# Patient Record
Sex: Male | Born: 1970 | Race: White | Hispanic: No | Marital: Single | Smoking: Never smoker
Health system: Southern US, Community
[De-identification: ages and names within clinical notes are randomized; demographics above are authoritative.]

## PROBLEM LIST (undated history)

## (undated) DIAGNOSIS — Z87442 Personal history of urinary calculi: Secondary | ICD-10-CM

## (undated) DIAGNOSIS — R519 Headache, unspecified: Secondary | ICD-10-CM

## (undated) DIAGNOSIS — C801 Malignant (primary) neoplasm, unspecified: Secondary | ICD-10-CM

## (undated) DIAGNOSIS — Z8673 Personal history of transient ischemic attack (TIA), and cerebral infarction without residual deficits: Secondary | ICD-10-CM

## (undated) DIAGNOSIS — J189 Pneumonia, unspecified organism: Secondary | ICD-10-CM

## (undated) DIAGNOSIS — K219 Gastro-esophageal reflux disease without esophagitis: Secondary | ICD-10-CM

## (undated) DIAGNOSIS — F319 Bipolar disorder, unspecified: Secondary | ICD-10-CM

## (undated) DIAGNOSIS — R51 Headache: Secondary | ICD-10-CM

## (undated) DIAGNOSIS — I10 Essential (primary) hypertension: Secondary | ICD-10-CM

## (undated) HISTORY — PX: NO PAST SURGERIES: SHX2092

## (undated) HISTORY — DX: Essential (primary) hypertension: I10

---

## 2009-04-27 DIAGNOSIS — Z8673 Personal history of transient ischemic attack (TIA), and cerebral infarction without residual deficits: Secondary | ICD-10-CM

## 2009-04-27 HISTORY — DX: Personal history of transient ischemic attack (TIA), and cerebral infarction without residual deficits: Z86.73

## 2009-06-14 ENCOUNTER — Ambulatory Visit: Payer: Self-pay | Admitting: Diagnostic Radiology

## 2009-06-14 ENCOUNTER — Emergency Department (HOSPITAL_BASED_OUTPATIENT_CLINIC_OR_DEPARTMENT_OTHER): Admission: EM | Admit: 2009-06-14 | Discharge: 2009-06-14 | Payer: Self-pay | Admitting: Emergency Medicine

## 2010-06-20 LAB — URINALYSIS, ROUTINE W REFLEX MICROSCOPIC
Bilirubin Urine: NEGATIVE
Ketones, ur: NEGATIVE mg/dL
Protein, ur: NEGATIVE mg/dL
Urobilinogen, UA: 0.2 mg/dL (ref 0.0–1.0)

## 2010-06-20 LAB — BASIC METABOLIC PANEL
GFR calc Af Amer: >60 mL/min (ref >60)
GFR calc non Af Amer: >60 mL/min (ref >60)
Potassium: 3.5 mEq/L (ref 3.5–5.1)
Sodium: 144 mEq/L (ref 135–145)

## 2010-06-20 LAB — DIFFERENTIAL
Basophils Absolute: 0.1 10*3/uL (ref 0.0–0.1)
Eosinophils Absolute: 0.1 10*3/uL (ref 0.0–0.7)
Eosinophils Relative: 2 % (ref 0–5)
Monocytes Absolute: 0.6 10*3/uL (ref 0.1–1.0)
Monocytes Relative: 7 % (ref 3–12)

## 2010-06-20 LAB — CBC
Platelets: 265 10*3/uL (ref 150–400)
RBC: 5.25 MIL/uL (ref 4.22–5.81)
RDW: 12.4 % (ref 11.5–15.5)
WBC: 9.4 10*3/uL (ref 4.0–10.5)

## 2010-06-20 LAB — POCT TOXICOLOGY PANEL

## 2010-06-20 LAB — T3 UPTAKE: T3 Uptake Ratio: 40.3 % — ABNORMAL HIGH (ref 22.5–37.0)

## 2010-06-20 LAB — TSH: TSH: 0.693 u[IU]/mL (ref 0.350–4.500)

## 2010-06-20 LAB — T4: T4, Total: 8.9 ug/dL (ref 5.0–12.5)

## 2011-05-08 ENCOUNTER — Other Ambulatory Visit: Payer: Self-pay | Admitting: *Deleted

## 2011-05-08 MED ORDER — LISINOPRIL-HYDROCHLOROTHIAZIDE 10-12.5 MG PO TABS: 2.0000 | ORAL_TABLET | Freq: Every day | ORAL | Status: DC

## 2011-09-13 ENCOUNTER — Ambulatory Visit (INDEPENDENT_AMBULATORY_CARE_PROVIDER_SITE_OTHER): Payer: BC Managed Care – PPO | Admitting: Family Medicine

## 2011-09-13 VITALS — BP 115/82 | HR 112 | Temp 98.0°F | Resp 18 | Ht 67.0 in | Wt 229.0 lb

## 2011-09-13 DIAGNOSIS — Z0000 Encounter for general adult medical examination without abnormal findings: Secondary | ICD-10-CM

## 2011-09-13 DIAGNOSIS — F39 Unspecified mood [affective] disorder: Secondary | ICD-10-CM

## 2011-09-13 DIAGNOSIS — I10 Essential (primary) hypertension: Secondary | ICD-10-CM

## 2011-09-13 DIAGNOSIS — D751 Secondary polycythemia: Secondary | ICD-10-CM

## 2011-09-13 LAB — COMPREHENSIVE METABOLIC PANEL
ALT: 31 U/L (ref 0–53)
AST: 24 U/L (ref 0–37)
Albumin: 4.7 g/dL (ref 3.5–5.2)
BUN: 16 mg/dL (ref 6–23)
Calcium: 10.4 mg/dL (ref 8.4–10.5)
Chloride: 106 mEq/L (ref 96–112)
Creat: 1.24 mg/dL (ref 0.50–1.35)
Total Bilirubin: 1.1 mg/dL (ref 0.3–1.2)

## 2011-09-13 LAB — LIPID PANEL
Cholesterol: 190 mg/dL (ref 0–200)
HDL: 35 mg/dL — ABNORMAL LOW
LDL Cholesterol: 137 mg/dL — ABNORMAL HIGH (ref 0–99)
Total CHOL/HDL Ratio: 5.4 ratio
Triglycerides: 92 mg/dL
VLDL: 18 mg/dL (ref 0–40)

## 2011-09-13 LAB — POCT CBC
Granulocyte percent: 63.3 % (ref 37–80)
HCT, POC: 56.3 % — AB (ref 43.5–53.7)
Hemoglobin: 18.3 g/dL — AB (ref 14.1–18.1)
Lymph, poc: 3 (ref 0.6–3.4)
MCH, POC: 31.1 pg (ref 27–31.2)
MCHC: 32.5 g/dL (ref 31.8–35.4)
MCV: 95.6 fL (ref 80–97)
MID (cbc): 0.8 (ref 0–0.9)
MPV: 9.3 fL (ref 0–99.8)
POC Granulocyte: 6.5 (ref 2–6.9)
POC LYMPH PERCENT: 29.1 % (ref 10–50)
POC MID %: 7.6 % (ref 0–12)
Platelet Count, POC: 330 10*3/uL (ref 142–424)
RBC: 5.89 M/uL (ref 4.69–6.13)
RDW, POC: 13.1 %
WBC: 10.2 10*3/uL (ref 4.6–10.2)

## 2011-09-13 MED ORDER — LAMOTRIGINE 100 MG PO TABS: 150.0000 mg | ORAL_TABLET | Freq: Every day | ORAL | Status: DC

## 2011-09-13 MED ORDER — LISINOPRIL-HYDROCHLOROTHIAZIDE 20-25 MG PO TABS: 1.0000 | ORAL_TABLET | Freq: Every day | ORAL | Status: DC

## 2011-09-13 NOTE — Addendum Note (Signed)
Addended by: Abbe Amsterdam C on: 09/13/2011 02:52 PM   Modules accepted: Orders

## 2011-09-13 NOTE — Progress Notes (Signed)
Patient Name: [name] Date of Birth: 17-Aug-1970 Medical Record Number: [id number] Gender: male Date of Encounter: 09/13/2011  History of Present Illness:  [name] is a 41 y.o. very pleasant male patient who presents with the following:  Here today for a PE.  He is being treated for a mood disorder and is stable on his medications.  However, his psychiatrist has moved away, and he needs to find a new one.  He is fasting today for labs.  He is aware that he needs to lose weight and lately has been working on eating less.  He had lost down to 205 lbs, but gained it back in the interim.  He has been too busy to eat well sometimes.    Discussed PSA testing - he would like to delay this  Needs a form filled out for his job- height/ weight/ BP/ etc  There is no problem list on file for this patient.  No past medical history on file. No past surgical history on file. History  Substance Use Topics  . Smoking status: Never Smoker   . Smokeless tobacco: Not on file  . Alcohol Use: Not on file   No family history on file. Allergies  Allergen Reactions  . Codeine     Medication list has been reviewed and updated.  Prior to Admission medications   Medication Sig Start Date End Date Taking? Authorizing Provider  buPROPion (WELLBUTRIN XL) 300 MG 24 hr tablet Take 300 mg by mouth daily.   Yes Historical Provider, MD  diphenhydrAMINE (SOMINEX) 25 MG tablet Take 25 mg by mouth at bedtime as needed.   Yes Historical Provider, MD  lamoTRIgine (LAMICTAL) 150 MG tablet Take 150 mg by mouth daily.   Yes Historical Provider, MD  lisinopril-hydrochlorothiazide (PRINZIDE,ZESTORETIC) 10-12.5 MG per tablet Take 2 tablets by mouth daily. 05/08/11 05/07/12  Sondra Barges, PA-C    Review of Systems:  As per HPI- otherwise negative.   Physical Examination: Filed Vitals:   09/13/11 1337  BP: 115/82  Pulse: 112  Temp: 98 F (36.7 C)  Resp: 18   Filed Vitals:   09/13/11 1337  Height:  5\' 7"  (1.702 m)  Weight: 229 lb (103.874 kg)  recheck pulse 90 BPM Body mass index is 35.87 kg/(m^2). Ideal Body Weight: Weight in (lb) to have BMI = 25: 159.3   GEN: WDWN, NAD, Non-toxic, A & O x 3, obese HEENT: Atraumatic, Normocephalic. Neck supple. No masses, No LAD.  TM and oropharynx wnl Ears and Nose: No external deformity. CV: RRR, No M/G/R. No JVD. No thrill. No extra heart sounds. PULM: CTA B, no wheezes, crackles, rhonchi. No retractions. No resp. distress. No accessory muscle use. ABD: S, NT, ND, +BS. No rebound. No HSM. EXTR: No c/c/e NEURO Normal gait.  PSYCH: Normally interactive. Conversant. Not depressed or anxious appearing.  Calm demeanor.  GU: normal genitals, no testicular masses, normal rectal exam  Assessment and Plan: 1. Physical exam, annual  POCT CBC, Comprehensive metabolic panel, Lipid panel  2. Hypertension  lisinopril-hydrochlorothiazide (PRINZIDE,ZESTORETIC) 20-25 MG per tablet  3. Mood disorder  lamoTRIgine (LAMICTAL) 100 MG tablet   BP well controlled, refilled medication.  He is trying to find a new psychiatrist- will do a referral for him.  In the meantime we can refill his medications as needed.  He could not remember his wellbutrin dose- when he needs a refill he can ask the pharmacy to send a RF request and I will refill  this.   Encouraged him to work on losing weight by exercising more and watching his diet.  He does not smoke  Will plan further follow- up pending labs.   Abbe Amsterdam, MD

## 2011-09-14 ENCOUNTER — Encounter: Payer: Self-pay | Admitting: Family Medicine

## 2011-09-14 NOTE — Addendum Note (Signed)
Addended by: Abbe Amsterdam C on: 09/14/2011 02:41 PM   Modules accepted: Orders

## 2011-10-08 ENCOUNTER — Ambulatory Visit: Payer: BC Managed Care – PPO | Admitting: Family Medicine

## 2011-10-08 VITALS — BP 138/90 | HR 76 | Temp 98.3°F | Resp 16 | Ht 66.75 in | Wt 239.0 lb

## 2011-10-08 DIAGNOSIS — R05 Cough: Secondary | ICD-10-CM

## 2011-10-08 DIAGNOSIS — D751 Secondary polycythemia: Secondary | ICD-10-CM

## 2011-10-08 DIAGNOSIS — D45 Polycythemia vera: Secondary | ICD-10-CM

## 2011-10-08 LAB — POCT CBC
Granulocyte percent: 64 %G (ref 37–80)
HCT, POC: 51.2 % (ref 43.5–53.7)
Lymph, poc: 2.3 (ref 0.6–3.4)
MCH, POC: 31.6 pg — AB (ref 27–31.2)
MCV: 99.3 fL — AB (ref 80–97)
MPV: 9.3 fL (ref 0–99.8)
POC Granulocyte: 5.2 (ref 2–6.9)
Platelet Count, POC: 237 10*3/uL (ref 142–424)
RBC: 5.16 M/uL (ref 4.69–6.13)
WBC: 8.2 10*3/uL (ref 4.6–10.2)

## 2011-10-08 MED ORDER — AZITHROMYCIN 250 MG PO TABS: ORAL_TABLET | ORAL | Status: AC

## 2011-10-08 MED ORDER — ALBUTEROL SULFATE HFA 108 (90 BASE) MCG/ACT IN AERS: 2.0000 | INHALATION_SPRAY | Freq: Four times a day (QID) | RESPIRATORY_TRACT | Status: DC | PRN

## 2011-10-08 NOTE — Progress Notes (Signed)
Date:  10/08/2011   Name:  [name]   DOB:  1970/12/15   MRN:  [id number]  PCP:  No primary provider on file.    Chief Complaint: Cough  History of Present Illness:  [name] is a 42 y.o. very pleasant male patient who presents with the following:  He has noted a cough for about one month- it has been getting worse.  He sometimes coughs up clear, loose phlegm. He otherwise feels ok- no fever, chills, etc.  No runny nose, no sneezing.  No itchy eyes.  He has occasionally coughed until he has emesis.  He has noted some hard coughing fits. This tends to occur about once a year, but not always in the same season.    [name] states that he did not receive the letter than I send after his PE last month.  Printed out a copy and gave it to him today and discussed   There is no problem list on file for this patient.   No past medical history on file.  No past surgical history on file.  History  Substance Use Topics  . Smoking status: Never Smoker   . Smokeless tobacco: Not on file  . Alcohol Use: Not on file    No family history on file.  Allergies  Allergen Reactions  . Codeine     Medication list has been reviewed and updated.  Current Outpatient Prescriptions on File Prior to Visit  Medication Sig Dispense Refill  . buPROPion (WELLBUTRIN XL) 300 MG 24 hr tablet Take 300 mg by mouth daily.      Marland Kitchen lamoTRIgine (LAMICTAL) 100 MG tablet Take 1.5 tablets (150 mg total) by mouth daily.  135 tablet  1  . lisinopril-hydrochlorothiazide (PRINZIDE,ZESTORETIC) 20-25 MG per tablet Take 1 tablet by mouth daily.  90 tablet  3  . diphenhydrAMINE (SOMINEX) 25 MG tablet Take 25 mg by mouth at bedtime as needed.        Review of Systems: As per HPI- otherwise negative.   Physical Examination: Filed Vitals:   10/08/11 0850  BP: 138/90  Pulse: 76  Temp: 98.3 F (36.8 C)  Resp: 16   Filed Vitals:   10/08/11 0850  Height: 5' 6.75" (1.695 m)  Weight: 239 lb (108.41 kg)   Body  mass index is 37.71 kg/(m^2). Ideal Body Weight: Weight in (lb) to have BMI = 25: 158.1   GEN: WDWN, NAD, Non-toxic, A & O x 3, obese HEENT: Atraumatic, Normocephalic. Neck supple. No masses, No LAD.  TM and oropharynx wnl.  PEERL EOMI, nasal cavity wnl Ears and Nose: No external deformity. CV: RRR, No M/G/R. No JVD. No thrill. No extra heart sounds. PULM: CTA B,  crackles, rhonchi. No retractions. No resp. distress. No accessory muscle use.  Minimal wheeze bilaterally EXTR: No c/c/e NEURO Normal gait.  PSYCH: Normally interactive. Conversant. Not depressed or anxious appearing.  Calm demeanor.   Results for orders placed in visit on 10/08/11  POCT CBC      Component Value Range   WBC 8.2  4.6 - 10.2 K/uL   Lymph, poc 2.3  0.6 - 3.4   POC LYMPH PERCENT 27.5  10 - 50 %L   MID (cbc) 0.7  0 - 0.9   POC MID % 8.5  0 - 12 %M   POC Granulocyte 5.2  2 - 6.9   Granulocyte percent 64.0  37 - 80 %G   RBC 5.16  4.69 - 6.13 M/uL  Hemoglobin 16.3  14.1 - 18.1 g/dL   HCT, POC 13.0  86.5 - 53.7 %   MCV 99.3 (*) 80 - 97 fL   MCH, POC 31.6 (*) 27 - 31.2 pg   MCHC 31.8  31.8 - 35.4 g/dL   RDW, POC 78.4     Platelet Count, POC 237  142 - 424 K/uL   MPV 9.3  0 - 99.8 fL    Assessment and Plan: 1. Polycythemia  POCT CBC  2. Cough  azithromycin (ZITHROMAX) 250 MG tablet, albuterol (PROVENTIL HFA;VENTOLIN HFA) 108 (90 BASE) MCG/ACT inhaler   Polycythemia is resolved.  Discussed high cholesterol- he will work on diet and exercise, and plan to recheck in about 6 months.   Will also treat for bronchitis as above.  zpack and albuterol as needed.   Abbe Amsterdam, MD

## 2011-10-10 ENCOUNTER — Encounter: Payer: BC Managed Care – PPO | Admitting: Physician Assistant

## 2011-10-13 ENCOUNTER — Other Ambulatory Visit: Payer: Self-pay | Admitting: Physician Assistant

## 2012-01-18 NOTE — Progress Notes (Signed)
This encounter was created in error - please disregard.

## 2012-03-23 ENCOUNTER — Ambulatory Visit (INDEPENDENT_AMBULATORY_CARE_PROVIDER_SITE_OTHER): Payer: BC Managed Care – PPO | Admitting: Family Medicine

## 2012-03-23 VITALS — BP 126/82 | HR 75 | Temp 98.2°F | Resp 16 | Ht 66.75 in | Wt 242.0 lb

## 2012-03-23 DIAGNOSIS — M545 Low back pain: Secondary | ICD-10-CM

## 2012-03-23 MED ORDER — TRAMADOL-ACETAMINOPHEN 37.5-325 MG PO TABS: 1.0000 | ORAL_TABLET | Freq: Four times a day (QID) | ORAL | Status: DC | PRN

## 2012-03-23 MED ORDER — IBUPROFEN 800 MG PO TABS: 800.0000 mg | ORAL_TABLET | Freq: Three times a day (TID) | ORAL | Status: DC | PRN

## 2012-03-23 MED ORDER — METHYLPREDNISOLONE ACETATE 80 MG/ML IJ SUSP
80.0000 mg | Freq: Once | INTRAMUSCULAR | Status: AC
  Administered 2012-03-23: 80 mg via INTRAMUSCULAR

## 2012-03-23 NOTE — Progress Notes (Signed)
Subjective:    Patient ID: [name], male    DOB: 10-30-1970, 41 y.o.   MRN: [id number]  HPI  Last night was carrying his bags into his apt when his back suddenly went out and then went to work today.  Had vicodin from dentist so tried one while he was at work and he felt so loopy that he couldn't drive even 8 hrs later.  He passed out and woke up on the floor - unwitnessed but felt like he hit his head - think he might have been out for about an hr.    Past Medical History  Diagnosis Date  . Hypertension    Current Outpatient Prescriptions on File Prior to Visit  Medication Sig Dispense Refill  . buPROPion (WELLBUTRIN XL) 300 MG 24 hr tablet Take 300 mg by mouth daily.      Marland Kitchen lamoTRIgine (LAMICTAL) 100 MG tablet Take 1.5 tablets (150 mg total) by mouth daily.  135 tablet  1  . lisinopril-hydrochlorothiazide (PRINZIDE,ZESTORETIC) 20-25 MG per tablet Take 1 tablet by mouth daily.  90 tablet  3  . albuterol (PROVENTIL HFA;VENTOLIN HFA) 108 (90 BASE) MCG/ACT inhaler Inhale 2 puffs into the lungs every 6 (six) hours as needed for wheezing.  1 Inhaler  0  . diphenhydrAMINE (SOMINEX) 25 MG tablet Take 25 mg by mouth at bedtime as needed.       Allergies  Allergen Reactions  . Codeine   . Hydrocodone-Acetaminophen      Review of Systems  Constitutional: Negative for fever and chills.  Gastrointestinal: Negative for abdominal pain, diarrhea and constipation.  Genitourinary: Negative for urgency, frequency, decreased urine volume and difficulty urinating.  Musculoskeletal: Positive for back pain, arthralgias and gait problem. Negative for myalgias and joint swelling.  Skin: Negative for color change and wound.  Neurological: Negative for dizziness, weakness, light-headedness and numbness.  Hematological: Does not bruise/bleed easily.      BP 126/82  Pulse 75  Temp 98.2 F (36.8 C) (Oral)  Resp 16  Ht 5' 6.75" (1.695 m)  Wt 242 lb (109.77 kg)  BMI 38.19 kg/m2  SpO2 98% Objective:    Physical Exam  Constitutional: He is oriented to person, place, and time. He appears well-developed and well-nourished. No distress.  HENT:  Head: Normocephalic and atraumatic.  Cardiovascular: Intact distal pulses.   Pulmonary/Chest: Effort normal.  Musculoskeletal: Normal range of motion. He exhibits tenderness. He exhibits no edema.       Thoracic back: Normal. He exhibits normal range of motion, no tenderness, no bony tenderness, no swelling, no deformity and no spasm.       Lumbar back: He exhibits tenderness and spasm. He exhibits normal range of motion, no bony tenderness, no edema and no deformity.       Negative straight leg raise bilaterally though pain localized into low back only w/ straight leg raise to 40 deg bilaterally.  Severe low back pain with standing or trying to lay flat, but none w/ sitting.  Neurological: He is alert and oriented to person, place, and time. He has normal strength and normal reflexes. He displays no atrophy. No sensory deficit. He exhibits normal muscle tone. Coordination and gait normal.  Reflex Scores:      Patellar reflexes are 2+ on the right side and 2+ on the left side.      Achilles reflexes are 2+ on the right side and 2+ on the left side. Skin: Skin is warm and dry. No rash noted. He  is not diaphoretic. No erythema.  Psychiatric: He has a normal mood and affect. His behavior is normal.      Assessment & Plan:   1. Low back pain  methylPREDNISolone acetate (DEPO-MEDROL) injection 80 mg IM x 1 now  Advised conservative treatment w/ 800 mg ibuprofen tid and prn ultracet (try 1/2 tab first). Rest w/ heat x 2-3d then increase back to usual activity. If any alarm sxs dev (weakness, numbness, f/c, changes in bowels/bladder), RTC immed for further eval. If still w/ pain in 6 wks, RTC for further eval. If pain continues to be severe or limiting, RTC sooner to consider imaging, steroid, or muscle relaxant.

## 2012-03-23 NOTE — Patient Instructions (Addendum)
I recommend rest and no heavy lifting or exertion for 2-3 days, just try to stay comfortable. Take the ibuprofen round the clock and the ultracet as needed (try 1/2 tab first tonight - not before work).  Then after 2-3days try to gradually return to activity.  You should be back to normal in about 6 weeks. If you still have pain at that point, return to clinic to consider imaging, physical therapy, and/or specialty referral. If you pain continues to be severe, then return to clinic sooner to consider xray of your low back and whether a course of steroids or muscle relaxants might help.

## 2012-08-23 ENCOUNTER — Ambulatory Visit (INDEPENDENT_AMBULATORY_CARE_PROVIDER_SITE_OTHER): Payer: BC Managed Care – PPO | Admitting: Family Medicine

## 2012-08-23 ENCOUNTER — Encounter: Payer: Self-pay | Admitting: Family Medicine

## 2012-08-23 ENCOUNTER — Ambulatory Visit: Payer: BC Managed Care – PPO

## 2012-08-23 VITALS — BP 115/79 | HR 74 | Temp 98.0°F | Resp 16 | Ht 67.0 in | Wt 229.0 lb

## 2012-08-23 DIAGNOSIS — I10 Essential (primary) hypertension: Secondary | ICD-10-CM

## 2012-08-23 DIAGNOSIS — Z79899 Other long term (current) drug therapy: Secondary | ICD-10-CM

## 2012-08-23 DIAGNOSIS — R05 Cough: Secondary | ICD-10-CM

## 2012-08-23 DIAGNOSIS — J42 Unspecified chronic bronchitis: Secondary | ICD-10-CM

## 2012-08-23 LAB — POCT CBC
HCT, POC: 51.5 % (ref 43.5–53.7)
Hemoglobin: 16.8 g/dL (ref 14.1–18.1)
MCHC: 32.6 g/dL (ref 31.8–35.4)
MCV: 99.5 fL — AB (ref 80–97)
MID (cbc): 0.6 (ref 0–0.9)
MPV: 9.4 fL (ref 0–99.8)
POC LYMPH PERCENT: 24.4 %L (ref 10–50)
WBC: 9.2 10*3/uL (ref 4.6–10.2)

## 2012-08-23 LAB — COMPREHENSIVE METABOLIC PANEL
Creat: 1.05 mg/dL (ref 0.50–1.35)
Potassium: 3.8 mEq/L (ref 3.5–5.3)
Sodium: 139 mEq/L (ref 135–145)
Total Bilirubin: 0.8 mg/dL (ref 0.3–1.2)

## 2012-08-23 MED ORDER — FLUTICASONE PROPIONATE 50 MCG/ACT NA SUSP: 2.0000 | Freq: Every day | NASAL | Status: DC

## 2012-08-23 MED ORDER — ALBUTEROL SULFATE HFA 108 (90 BASE) MCG/ACT IN AERS: 2.0000 | INHALATION_SPRAY | RESPIRATORY_TRACT | Status: DC | PRN

## 2012-08-23 MED ORDER — BENZONATATE 200 MG PO CAPS: 200.0000 mg | ORAL_CAPSULE | Freq: Three times a day (TID) | ORAL | Status: DC | PRN

## 2012-08-23 MED ORDER — CETIRIZINE HCL 10 MG PO TABS: 10.0000 mg | ORAL_TABLET | Freq: Every day | ORAL | Status: DC

## 2012-08-23 MED ORDER — LISINOPRIL-HYDROCHLOROTHIAZIDE 20-25 MG PO TABS: 1.0000 | ORAL_TABLET | Freq: Every day | ORAL | Status: DC

## 2012-08-23 NOTE — Progress Notes (Addendum)
Subjective:    Patient ID: [name], male    DOB: [birth year], 42 y.o.   MRN: [id number] Chief Complaint  Patient presents with  . establish pcp  . Cough    1 mth    HPI  Mr. [name] is a delightful 42 yo male who is here to est a PCP.  His main concern today is that every winter or spring, he develops a persistent cough that lasts for a month or two. He usually doesn't seek medical care until it becomes really severe and then requires several rounds of antibiotics to get rid of it. This has been happening annually for about 15 yrs.  He has now had it for a month though hasn't been bad enough to seek medical care yet.  He is coughing up a little white phlegm.  Is coughing to the point of regurgitation at times.  Is not using any otc meds other than occ cough drops.  Is accompanied by rhinitis and PND.  Has been rx'ed albuterol prev which did seem to help. Has also tried a variety of nasal sprays which did not work.  Does have some heartburn treated with tums - drinks baking soda in water about 1x/wk to help.  At work had health screening done: Lipid panel T chol 144, trig 66, hdl 44, ldl 87. cbg 97.  bp 125/86, HR 70  Past Medical History  Diagnosis Date  . Hypertension    Current Outpatient Prescriptions on File Prior to Visit  Medication Sig Dispense Refill  . traMADol-acetaminophen (ULTRACET) 37.5-325 MG per tablet Take 1 tablet by mouth every 6 (six) hours as needed for pain.  30 tablet  0   No current facility-administered medications on file prior to visit.   Allergies  Allergen Reactions  . Codeine   . Hydrocodone-Acetaminophen      Review of Systems  Constitutional: Positive for fatigue. Negative for fever, chills, activity change and appetite change.  HENT: Positive for congestion, postnasal drip, rhinorrhea and sore throat. Negative for ear discharge, ear pain, sinus pressure, sneezing and trouble swallowing.   Respiratory: Positive for cough and chest tightness.  Negative for shortness of breath and wheezing.   Cardiovascular: Positive for chest pain.  Gastrointestinal: Positive for vomiting and abdominal pain. Negative for nausea, diarrhea and constipation.  Genitourinary: Negative for dysuria and difficulty urinating.  Skin: Negative for rash.  Hematological: Negative for adenopathy.  Psychiatric/Behavioral: Positive for sleep disturbance.      BP 115/79  Pulse 74  Temp(Src) 98 F (36.7 C) (Oral)  Resp 16  Ht 5\' 7"  (1.702 m)  Wt 229 lb (103.874 kg)  BMI 35.86 kg/m2  PF 650 L/min Objective:   Physical Exam  Constitutional: He is oriented to person, place, and time. He appears well-developed and well-nourished. No distress.  HENT:  Head: Normocephalic and atraumatic.  Right Ear: Tympanic membrane, external ear and ear canal normal.  Left Ear: Tympanic membrane, external ear and ear canal normal.  Nose: Mucosal edema and rhinorrhea present.  Mouth/Throat: Oropharynx is clear and moist and mucous membranes are normal. No oropharyngeal exudate.  Eyes: Conjunctivae are normal. No scleral icterus.  Neck: Normal range of motion. Neck supple. No thyromegaly present.  Cardiovascular: Normal rate, regular rhythm, normal heart sounds and intact distal pulses.   Pulmonary/Chest: Effort normal. No respiratory distress. He has wheezes in the left lower field.  Abdominal: Soft. Bowel sounds are normal. He exhibits no distension and no mass. There is no tenderness. There is  no rebound and no guarding.  Musculoskeletal: He exhibits no edema.  Lymphadenopathy:    He has no cervical adenopathy.  Neurological: He is alert and oriented to person, place, and time.  Skin: Skin is warm and dry. He is not diaphoretic. No erythema.  Psychiatric: He has a normal mood and affect. His behavior is normal.    Results for orders placed in visit on 08/23/12  POCT CBC      Result Value Range   WBC 9.2  4.6 - 10.2 K/uL   Lymph, poc 2.2  0.6 - 3.4   POC LYMPH  PERCENT 24.4  10 - 50 %L   MID (cbc) 0.6  0 - 0.9   POC MID % 6.6  0 - 12 %M   POC Granulocyte 6.3  2 - 6.9   Granulocyte percent 69.0  37 - 80 %G   RBC 5.18  4.69 - 6.13 M/uL   Hemoglobin 16.8  14.1 - 18.1 g/dL   HCT, POC 16.1  09.6 - 53.7 %   MCV 99.5 (*) 80 - 97 fL   MCH, POC 32.4 (*) 27 - 31.2 pg   MCHC 32.6  31.8 - 35.4 g/dL   RDW, POC 04.5     Platelet Count, POC 256  142 - 424 K/uL   MPV 9.4  0 - 99.8 fL   [facility] reading (PRIMARY) by  Dr. Clelia Croft. CXR: no abnormality    Assessment & Plan:   Cough - Plan: DG Chest 2 View, Comprehensive metabolic panel, TSH, POCT CBC  Unspecified chronic bronchitis - discussed that if pt is requiring sev rounds of antibiotics - it is likely that it is not a bacterial cough in the first place and that he maybe taking a lot of unnecessary meds - reviewed causes of chronic cough like post-viral cough, RAD, cough-variant asthma, PND, GERD, etc.  Peak flow wnml today in clinic and CXR nml but try alternative measures rather than antibiotic first - if worsening - with f/c, can't sleep due to cough, coughing up abnml sputum,etc - will call in antibiotic.  Hypertension - Plan: lisinopril-hydrochlorothiazide (PRINZIDE,ZESTORETIC) 20-25 MG per tablet - well controlled  HM - reports tdap in 2011  Meds ordered this encounter  Medications  . lithium carbonate 150 MG capsule    Sig: Take 150 mg by mouth 3 (three) times daily with meals.  . traZODone (DESYREL) 150 MG tablet    Sig: Take 150 mg by mouth at bedtime.  . fluticasone (FLONASE) 50 MCG/ACT nasal spray    Sig: Place 2 sprays into the nose daily.    Dispense:  16 g    Refill:  6  . albuterol (PROVENTIL HFA;VENTOLIN HFA) 108 (90 BASE) MCG/ACT inhaler    Sig: Inhale 2 puffs into the lungs every 4 (four) hours as needed for wheezing (cough, shortness of breath or wheezing.).    Dispense:  1 Inhaler    Refill:  1  . benzonatate (TESSALON) 200 MG capsule    Sig: Take 1 capsule (200 mg total) by mouth 3  (three) times daily as needed for cough.    Dispense:  30 capsule    Refill:  0  . cetirizine (ZYRTEC) 10 MG tablet    Sig: Take 1 tablet (10 mg total) by mouth at bedtime.    Dispense:  30 tablet    Refill:  11  . lisinopril-hydrochlorothiazide (PRINZIDE,ZESTORETIC) 20-25 MG per tablet    Sig: Take 1 tablet by mouth daily.  Dispense:  90 tablet    Refill:  3

## 2012-08-26 DIAGNOSIS — I10 Essential (primary) hypertension: Secondary | ICD-10-CM | POA: Insufficient documentation

## 2012-10-22 ENCOUNTER — Ambulatory Visit (INDEPENDENT_AMBULATORY_CARE_PROVIDER_SITE_OTHER): Payer: BC Managed Care – PPO | Admitting: Family Medicine

## 2012-10-22 VITALS — BP 120/100 | HR 86 | Temp 98.0°F | Resp 16 | Ht 67.0 in | Wt 240.0 lb

## 2012-10-22 DIAGNOSIS — D7589 Other specified diseases of blood and blood-forming organs: Secondary | ICD-10-CM

## 2012-10-22 DIAGNOSIS — K219 Gastro-esophageal reflux disease without esophagitis: Secondary | ICD-10-CM

## 2012-10-22 DIAGNOSIS — F411 Generalized anxiety disorder: Secondary | ICD-10-CM

## 2012-10-22 DIAGNOSIS — I10 Essential (primary) hypertension: Secondary | ICD-10-CM

## 2012-10-22 DIAGNOSIS — R079 Chest pain, unspecified: Secondary | ICD-10-CM

## 2012-10-22 DIAGNOSIS — Z0000 Encounter for general adult medical examination without abnormal findings: Secondary | ICD-10-CM

## 2012-10-22 DIAGNOSIS — Z79899 Other long term (current) drug therapy: Secondary | ICD-10-CM

## 2012-10-22 MED ORDER — RANITIDINE HCL 150 MG PO TABS: 150.0000 mg | ORAL_TABLET | Freq: Two times a day (BID) | ORAL | Status: DC

## 2012-10-22 MED ORDER — HYDROXYZINE HCL 25 MG PO TABS: 12.5000 mg | ORAL_TABLET | Freq: Three times a day (TID) | ORAL | Status: DC | PRN

## 2012-10-22 NOTE — Progress Notes (Signed)
Subjective:    Patient ID: [name], male    DOB: 07/08/1970, 42 y.o.   MRN: [id number]  HPI  More anxiety for the past week - to the point where he had to leave work - next psych app tin 8/14ish.  Is sleeping ok when he takes his trazodone but doesn't take it on the weekends as he works 12 hrs and doesn't want to feel groggy.    Cough resolved. Has been feeling discomfort in his chest, stabbing pain in the middle - radiating through chest to back, feel like heartburn but not. No palpitations.  Does seem to come on when he is more anxious or worried.  Can last for hours.  Not exertional, no associated dyspnea or diaphoresis. Has been having occ GERD - takes occ tums or baking soda in water. Not exercising but paying for gym membership.   Did not take BP pills today - usually takes in afternoon due to work shifts.  Not checking BP outside office but thinks anxiety might be why his BP is up. Does have cuffs to check his BP at home.  Past Medical History  Diagnosis Date  . Hypertension    History reviewed. No pertinent past surgical history. Current Outpatient Prescriptions on File Prior to Visit  Medication Sig Dispense Refill  . lisinopril-hydrochlorothiazide (PRINZIDE,ZESTORETIC) 20-25 MG per tablet Take 1 tablet by mouth daily.  90 tablet  3  . lithium carbonate 150 MG capsule Take 150 mg by mouth 3 (three) times daily with meals.      . traZODone (DESYREL) 150 MG tablet Take 150 mg by mouth at bedtime.       No current facility-administered medications on file prior to visit.   Allergies  Allergen Reactions  . Codeine   . Hydrocodone-Acetaminophen    Family History  Problem Relation Age of Onset  . Diabetes Mother   . Hypertension Mother   . Diabetes Father   . Hypertension Father   . Hypertension Sister    History   Social History  . Marital Status: Single    Spouse Name: N/A    Number of Children: N/A  . Years of Education: N/A   Social History Main Topics  .  Smoking status: Never Smoker   . Smokeless tobacco: None  . Alcohol Use: Yes  . Drug Use: No  . Sexual Activity: None   Other Topics Concern  . None   Social History Narrative  . None      Review of Systems  Constitutional: Positive for fatigue.  Cardiovascular: Positive for chest pain.  Gastrointestinal: Positive for abdominal pain.  Musculoskeletal: Positive for back pain and arthralgias.  Psychiatric/Behavioral: Positive for sleep disturbance and dysphoric mood. The patient is nervous/anxious.   All other systems reviewed and are negative.      BP 120/100  Pulse 86  Temp(Src) 98 F (36.7 C) (Oral)  Resp 16  Ht 5\' 7"  (1.702 m)  Wt 240 lb (108.863 kg)  BMI 37.58 kg/m2  SpO2 99% Objective:   Physical Exam  Constitutional: He is oriented to person, place, and time. He appears well-developed and well-nourished. No distress.  HENT:  Head: Normocephalic and atraumatic.  Right Ear: Tympanic membrane, external ear and ear canal normal.  Left Ear: Tympanic membrane, external ear and ear canal normal.  Nose: Nose normal.  Mouth/Throat: Oropharynx is clear and moist and mucous membranes are normal. No oropharyngeal exudate.  Eyes: Conjunctivae are normal. No scleral icterus.  Neck: Normal range  of motion. Neck supple. No thyromegaly present.  Cardiovascular: Normal rate, regular rhythm, normal heart sounds and intact distal pulses.   Pulmonary/Chest: Effort normal and breath sounds normal. No respiratory distress.  Musculoskeletal: He exhibits no edema.  Lymphadenopathy:    He has no cervical adenopathy.  Neurological: He is alert and oriented to person, place, and time.  Skin: Skin is warm and dry. He is not diaphoretic. No erythema.  Psychiatric: He has a normal mood and affect. His behavior is normal.     [facility] reading (PRIMARY) by  Dr. Clelia Croft. EKG: NSR, no ischemic changes  Assessment & Plan:  Routine general medical examination at a health care facility - Plan:  PSA  Macrocytosis without anemia - Plan: Vitamin B12, Folate  Chest pain - Plan: EKG 12-Lead - very atypical - try treating gerd and anxiety and hopefully will stop. If persists after trial of trx for these, cons cards eval.  Generalized anxiety disorder - try prn hydroxyzine and f/u w/ psych  GERD (gastroesophageal reflux disease)  Encounter for long-term (current) use of other medications - Plan: Basic metabolic panel  Meds ordered this encounter  Medications  . ranitidine (ZANTAC) 150 MG tablet    Sig: Take 1 tablet (150 mg total) by mouth 2 (two) times daily. For heartburn and reflux.    Dispense:  60 tablet    Refill:  2  . hydrOXYzine (ATARAX/VISTARIL) 25 MG tablet    Sig: Take 0.5-1 tablets (12.5-25 mg total) by mouth every 8 (eight) hours as needed for anxiety.    Dispense:  30 tablet    Refill:  1

## 2012-10-22 NOTE — Patient Instructions (Addendum)
Take you blood pressure at home when you are relaxed, have taken your medication, and have been sitting for at least 5 minutes, keep your arm rested on a table or the arm of the chair/couch so it is about the level of your heart.  Call or email with BP results 1-2x/wk over the next several weeks so we can make sure your medication is working. We will try the heartburn medication (ranitidine) and the anxiety medication (hydroxyzine) to see if this stops your chest pain. If you continue to have pain, we should get you to a cardiologist for a stress test to ensure it is not coming from your heart.  Keeping you healthy  Get these tests  Blood pressure- Have your blood pressure checked once a year by your healthcare provider.  Normal blood pressure is 120/80.  Weight- Have your body mass index (BMI) calculated to screen for obesity.  BMI is a measure of body fat based on height and weight. You can also calculate your own BMI at https://www.west-esparza.com/.  Cholesterol- Have your cholesterol checked regularly starting at age 77, sooner may be necessary if you have diabetes, high blood pressure, if a family member developed heart diseases at an early age or if you smoke.   Chlamydia, HIV, and other sexual transmitted disease- Get screened each year until the age of 31 then within three months of each new sexual partner.  Diabetes- Have your blood sugar checked regularly if you have high blood pressure, high cholesterol, a family history of diabetes or if you are overweight.  Get these vaccines  Flu shot- Every fall.  Tetanus shot- Every 10 years.  Menactra- Single dose; prevents meningitis.  Take these steps  Don't smoke- If you do smoke, ask your healthcare provider about quitting. For tips on how to quit, go to www.smokefree.gov or call 1-800-QUIT-NOW.  Be physically active- Exercise 5 days a week for at least 30 minutes.  If you are not already physically active start slow and gradually work  up to 30 minutes of moderate physical activity.  Examples of moderate activity include walking briskly, mowing the yard, dancing, swimming bicycling, etc.  Eat a healthy diet- Eat a variety of healthy foods such as fruits, vegetables, low fat milk, low fat cheese, yogurt, lean meats, poultry, fish, beans, tofu, etc.  For more information on healthy eating, go to www.thenutritionsource.org  Drink alcohol in moderation- Limit alcohol intake two drinks or less a day.  Never drink and drive.  Dentist- Brush and floss teeth twice daily; visit your dentis twice a year.  Depression-Your emotional health is as important as your physical health.  If you're feeling down, losing interest in things you normally enjoy please talk with your healthcare provider.  Gun Safety- If you keep a gun in your home, keep it unloaded and with the safety lock on.  Bullets should be stored separately.  Helmet use- Always wear a helmet when riding a motorcycle, bicycle, rollerblading or skateboarding.  Safe sex- If you may be exposed to a sexually transmitted infection, use a condom  Seat belts- Seat bels can save your life; always wear one.  Smoke/Carbon Monoxide detectors- These detectors need to be installed on the appropriate level of your home.  Replace batteries at least once a year.  Skin Cancer- When out in the sun, cover up and use sunscreen SPF 15 or higher.  Violence- If anyone is threatening or hurting you, please tell your healthcare provider.

## 2012-10-23 LAB — BASIC METABOLIC PANEL
CO2: 24 mEq/L (ref 19–32)
Calcium: 10.6 mg/dL — ABNORMAL HIGH (ref 8.4–10.5)
Chloride: 103 mEq/L (ref 96–112)
Creat: 1.02 mg/dL (ref 0.50–1.35)
Glucose, Bld: 78 mg/dL (ref 70–99)
Sodium: 140 mEq/L (ref 135–145)

## 2012-11-07 ENCOUNTER — Encounter: Payer: Self-pay | Admitting: Family Medicine

## 2012-11-25 ENCOUNTER — Other Ambulatory Visit: Payer: Self-pay | Admitting: Family Medicine

## 2013-04-22 ENCOUNTER — Telehealth: Payer: Self-pay

## 2013-04-22 ENCOUNTER — Ambulatory Visit: Payer: BC Managed Care – PPO | Admitting: Family Medicine

## 2013-04-22 VITALS — BP 142/92 | HR 80 | Temp 98.0°F | Resp 17 | Ht 67.5 in | Wt 244.0 lb

## 2013-04-22 DIAGNOSIS — R110 Nausea: Secondary | ICD-10-CM

## 2013-04-22 DIAGNOSIS — F319 Bipolar disorder, unspecified: Secondary | ICD-10-CM

## 2013-04-22 DIAGNOSIS — R42 Dizziness and giddiness: Secondary | ICD-10-CM

## 2013-04-22 LAB — POCT CBC
Granulocyte percent: 66.9 %G (ref 37–80)
HEMATOCRIT: 49.9 % (ref 43.5–53.7)
HEMOGLOBIN: 15.9 g/dL (ref 14.1–18.1)
LYMPH, POC: 2.1 (ref 0.6–3.4)
MCH: 31.7 pg — AB (ref 27–31.2)
MCHC: 31.9 g/dL (ref 31.8–35.4)
MCV: 99.5 fL — AB (ref 80–97)
MID (cbc): 0.6 (ref 0–0.9)
MPV: 9 fL (ref 0–99.8)
POC GRANULOCYTE: 5.6 (ref 2–6.9)
POC LYMPH PERCENT: 25.5 %L (ref 10–50)
POC MID %: 7.6 %M (ref 0–12)
Platelet Count, POC: 245 10*3/uL (ref 142–424)
RBC: 5.02 M/uL (ref 4.69–6.13)
RDW, POC: 12.9 %
WBC: 8.3 10*3/uL (ref 4.6–10.2)

## 2013-04-22 LAB — BASIC METABOLIC PANEL
BUN: 12 mg/dL (ref 6–23)
CHLORIDE: 106 meq/L (ref 96–112)
CO2: 25 mEq/L (ref 19–32)
Calcium: 10 mg/dL (ref 8.4–10.5)
Creat: 0.93 mg/dL (ref 0.50–1.35)
Glucose, Bld: 92 mg/dL (ref 70–99)
POTASSIUM: 4.2 meq/L (ref 3.5–5.3)
Sodium: 140 mEq/L (ref 135–145)

## 2013-04-22 LAB — LITHIUM LEVEL: Lithium Lvl: 0.1 mEq/L — ABNORMAL LOW (ref 0.80–1.40)

## 2013-04-22 LAB — GLUCOSE, POCT (MANUAL RESULT ENTRY): POC GLUCOSE: 85 mg/dL (ref 70–99)

## 2013-04-22 MED ORDER — MECLIZINE HCL 25 MG PO TABS
ORAL_TABLET | ORAL | Status: DC
Start: 2013-04-22 — End: 2014-09-03

## 2013-04-22 NOTE — Progress Notes (Addendum)
Subjective: 43 year old man who has been having problems over the last week or so with mild vertigo. He just has a lightheaded and nausea type dizziness. He is able to move around, he feels better laying down. He has not noticed any neurologic symptoms other than this. He does have a history of bipolar disorder, and is on lithium. The dose was recently raised from 30 mg to 600 mg approximately 2 months ago. He was taken off antidepressants he been on for a while, and that was at the same time. He does not describe any other neuromuscular type symptoms.  This happened virtually every day over the past week. He is not having a lot of symptoms this morning. Last night he had a bad episode and had to go to work 4 hours late  Objective Pleasant alert gentleman in no major acute distress. His TMs are normal. Eyes PERRLA. Fundi benign. EOMs intact. Throat clear. Neck supple without nodes thyromegaly. Chest is clear to auscultation. Heart regular without murmurs. Cranial nerves grossly intact. Finger to nose normal. Negative Romberg.  Assessment: Vertigo Nausea  Plan: Check labs including lithium level, cbc, glu, bmet.  Treat symptomatically  Results for orders placed in visit on 04/22/13  POCT CBC      Result Value Range   WBC 8.3  4.6 - 10.2 K/uL   Lymph, poc 2.1  0.6 - 3.4   POC LYMPH PERCENT 25.5  10 - 50 %L   MID (cbc) 0.6  0 - 0.9   POC MID % 7.6  0 - 12 %M   POC Granulocyte 5.6  2 - 6.9   Granulocyte percent 66.9  37 - 80 %G   RBC 5.02  4.69 - 6.13 M/uL   Hemoglobin 15.9  14.1 - 18.1 g/dL   HCT, POC 49.9  43.5 - 53.7 %   MCV 99.5 (*) 80 - 97 fL   MCH, POC 31.7 (*) 27 - 31.2 pg   MCHC 31.9  31.8 - 35.4 g/dL   RDW, POC 12.9     Platelet Count, POC 245  142 - 424 K/uL   MPV 9.0  0 - 99.8 fL  GLUCOSE, POCT (MANUAL RESULT ENTRY)      Result Value Range   POC Glucose 85  70 - 99 mg/dl   Probably supple vertigo. However need to rule out lithium toxicity which he is on for his  bipolar.  Results for orders placed in visit on 40/98/11  BASIC METABOLIC PANEL      Result Value Range   Sodium 140  135 - 145 mEq/L   Potassium 4.2  3.5 - 5.3 mEq/L   Chloride 106  96 - 112 mEq/L   CO2 25  19 - 32 mEq/L   Glucose, Bld 92  70 - 99 mg/dL   BUN 12  6 - 23 mg/dL   Creat 0.93  0.50 - 1.35 mg/dL   Calcium 10.0  8.4 - 10.5 mg/dL  LITHIUM LEVEL      Result Value Range   Lithium Lvl 0.10 (*) 0.80 - 1.40 mEq/L  POCT CBC      Result Value Range   WBC 8.3  4.6 - 10.2 K/uL   Lymph, poc 2.1  0.6 - 3.4   POC LYMPH PERCENT 25.5  10 - 50 %L   MID (cbc) 0.6  0 - 0.9   POC MID % 7.6  0 - 12 %M   POC Granulocyte 5.6  2 - 6.9  Granulocyte percent 66.9  37 - 80 %G   RBC 5.02  4.69 - 6.13 M/uL   Hemoglobin 15.9  14.1 - 18.1 g/dL   HCT, POC 49.9  43.5 - 53.7 %   MCV 99.5 (*) 80 - 97 fL   MCH, POC 31.7 (*) 27 - 31.2 pg   MCHC 31.9  31.8 - 35.4 g/dL   RDW, POC 12.9     Platelet Count, POC 245  142 - 424 K/uL   MPV 9.0  0 - 99.8 fL  GLUCOSE, POCT (MANUAL RESULT ENTRY)      Result Value Range   POC Glucose 85  70 - 99 mg/dl   We will inform him that his labs are good and he contact a copy of them to his psychiatrist

## 2013-04-22 NOTE — Telephone Encounter (Signed)
Notified pt of normal labs and slightly low lithium level, not cause of dizziness (per Dr Linna Darner). Pt understood. Mailed copy.

## 2013-04-22 NOTE — Patient Instructions (Signed)
Take antivert 1/2 to 1 pill every 6  Hours for dizziness.  Will cause drowsyness often.  Return if not improving or if worse at any time.

## 2013-04-22 NOTE — Progress Notes (Signed)
   Subjective:    Patient ID: [name], male    DOB: May 04, 1970, 43 y.o.   MRN: [id number]  HPI    Review of Systems     Objective:   Physical Exam        Assessment & Plan:

## 2013-04-24 ENCOUNTER — Encounter: Payer: Self-pay | Admitting: Family Medicine

## 2013-07-19 ENCOUNTER — Other Ambulatory Visit: Payer: Self-pay | Admitting: Family Medicine

## 2013-09-03 ENCOUNTER — Other Ambulatory Visit: Payer: Self-pay | Admitting: Family Medicine

## 2014-04-27 DIAGNOSIS — J189 Pneumonia, unspecified organism: Secondary | ICD-10-CM

## 2014-04-27 HISTORY — DX: Pneumonia, unspecified organism: J18.9

## 2014-05-15 ENCOUNTER — Emergency Department (HOSPITAL_BASED_OUTPATIENT_CLINIC_OR_DEPARTMENT_OTHER): Payer: BLUE CROSS/BLUE SHIELD

## 2014-05-15 ENCOUNTER — Encounter (HOSPITAL_BASED_OUTPATIENT_CLINIC_OR_DEPARTMENT_OTHER): Payer: Self-pay | Admitting: Emergency Medicine

## 2014-05-15 ENCOUNTER — Inpatient Hospital Stay (HOSPITAL_BASED_OUTPATIENT_CLINIC_OR_DEPARTMENT_OTHER)
Admission: EM | Admit: 2014-05-15 | Discharge: 2014-05-18 | DRG: 194 | Disposition: A | Discharge status: Home / Self Care | Payer: BLUE CROSS/BLUE SHIELD | Attending: Internal Medicine | Admitting: Internal Medicine

## 2014-05-15 DIAGNOSIS — Z8249 Family history of ischemic heart disease and other diseases of the circulatory system: Secondary | ICD-10-CM

## 2014-05-15 DIAGNOSIS — R05 Cough: Secondary | ICD-10-CM | POA: Diagnosis not present

## 2014-05-15 DIAGNOSIS — R197 Diarrhea, unspecified: Secondary | ICD-10-CM

## 2014-05-15 DIAGNOSIS — K760 Fatty (change of) liver, not elsewhere classified: Secondary | ICD-10-CM | POA: Diagnosis present

## 2014-05-15 DIAGNOSIS — R112 Nausea with vomiting, unspecified: Secondary | ICD-10-CM | POA: Diagnosis present

## 2014-05-15 DIAGNOSIS — Z885 Allergy status to narcotic agent status: Secondary | ICD-10-CM

## 2014-05-15 DIAGNOSIS — E860 Dehydration: Secondary | ICD-10-CM | POA: Diagnosis present

## 2014-05-15 DIAGNOSIS — R7401 Elevation of levels of liver transaminase levels: Secondary | ICD-10-CM | POA: Diagnosis present

## 2014-05-15 DIAGNOSIS — F319 Bipolar disorder, unspecified: Secondary | ICD-10-CM | POA: Diagnosis present

## 2014-05-15 DIAGNOSIS — N2889 Other specified disorders of kidney and ureter: Secondary | ICD-10-CM | POA: Diagnosis present

## 2014-05-15 DIAGNOSIS — R319 Hematuria, unspecified: Secondary | ICD-10-CM | POA: Diagnosis present

## 2014-05-15 DIAGNOSIS — R740 Nonspecific elevation of levels of transaminase and lactic acid dehydrogenase [LDH]: Secondary | ICD-10-CM | POA: Diagnosis present

## 2014-05-15 DIAGNOSIS — N289 Disorder of kidney and ureter, unspecified: Secondary | ICD-10-CM

## 2014-05-15 DIAGNOSIS — Z79899 Other long term (current) drug therapy: Secondary | ICD-10-CM

## 2014-05-15 DIAGNOSIS — N179 Acute kidney failure, unspecified: Secondary | ICD-10-CM | POA: Diagnosis present

## 2014-05-15 DIAGNOSIS — I10 Essential (primary) hypertension: Secondary | ICD-10-CM | POA: Diagnosis present

## 2014-05-15 DIAGNOSIS — B349 Viral infection, unspecified: Secondary | ICD-10-CM | POA: Diagnosis present

## 2014-05-15 DIAGNOSIS — Z833 Family history of diabetes mellitus: Secondary | ICD-10-CM

## 2014-05-15 DIAGNOSIS — J189 Pneumonia, unspecified organism: Principal | ICD-10-CM | POA: Diagnosis present

## 2014-05-15 DIAGNOSIS — D499 Neoplasm of unspecified behavior of unspecified site: Secondary | ICD-10-CM

## 2014-05-15 DIAGNOSIS — E871 Hypo-osmolality and hyponatremia: Secondary | ICD-10-CM

## 2014-05-15 LAB — CBC
HCT: 56 % — ABNORMAL HIGH (ref 39.0–52.0)
Hemoglobin: 19.8 g/dL — ABNORMAL HIGH (ref 13.0–17.0)
MCH: 31.4 pg (ref 26.0–34.0)
MCHC: 35.4 g/dL (ref 30.0–36.0)
MCV: 88.7 fL (ref 78.0–100.0)
Platelets: 161 10*3/uL (ref 150–400)
RBC: 6.31 MIL/uL — AB (ref 4.22–5.81)
RDW: 14 % (ref 11.5–15.5)
WBC: 4.8 10*3/uL (ref 4.0–10.5)

## 2014-05-15 LAB — LIPASE, BLOOD: Lipase: 86 U/L — ABNORMAL HIGH (ref 11–59)

## 2014-05-15 LAB — URINALYSIS, ROUTINE W REFLEX MICROSCOPIC
Bilirubin Urine: NEGATIVE
Glucose, UA: NEGATIVE mg/dL
Ketones, ur: 15 mg/dL — AB
LEUKOCYTES UA: NEGATIVE
Nitrite: NEGATIVE
Protein, ur: 100 mg/dL — AB
SPECIFIC GRAVITY, URINE: 1.016 (ref 1.005–1.030)
UROBILINOGEN UA: 1 mg/dL (ref 0.0–1.0)
pH: 6 (ref 5.0–8.0)

## 2014-05-15 LAB — URINE MICROSCOPIC-ADD ON

## 2014-05-15 LAB — COMPREHENSIVE METABOLIC PANEL
ALT: 70 U/L — AB (ref 0–53)
AST: 228 U/L — AB (ref 0–37)
Albumin: 3.5 g/dL (ref 3.5–5.2)
Alkaline Phosphatase: 111 U/L (ref 39–117)
Anion gap: 8 (ref 5–15)
BUN: 22 mg/dL (ref 6–23)
CO2: 28 mmol/L (ref 19–32)
Calcium: 8.2 mg/dL — ABNORMAL LOW (ref 8.4–10.5)
Chloride: 93 mmol/L — ABNORMAL LOW (ref 96–112)
Creatinine, Ser: 1.54 mg/dL — ABNORMAL HIGH (ref 0.50–1.35)
GFR calc Af Amer: 63 mL/min — ABNORMAL LOW (ref >90)
GFR calc non Af Amer: 54 mL/min — ABNORMAL LOW (ref >90)
Glucose, Bld: 152 mg/dL — ABNORMAL HIGH (ref 70–99)
POTASSIUM: 3.6 mmol/L (ref 3.5–5.1)
Sodium: 129 mmol/L — ABNORMAL LOW (ref 135–145)
Total Bilirubin: 1.1 mg/dL (ref 0.3–1.2)
Total Protein: 7.3 g/dL (ref 6.0–8.3)

## 2014-05-15 LAB — CBG MONITORING, ED: Glucose-Capillary: 137 mg/dL — ABNORMAL HIGH (ref 70–99)

## 2014-05-15 MED ORDER — IOHEXOL 300 MG/ML  SOLN
80.0000 mL | Freq: Once | INTRAMUSCULAR | Status: AC | PRN
  Administered 2014-05-15: 80 mL via INTRAVENOUS

## 2014-05-15 MED ORDER — INFLUENZA VAC SPLIT QUAD 0.5 ML IM SUSY
0.5000 mL | PREFILLED_SYRINGE | INTRAMUSCULAR | Status: AC
  Administered 2014-05-16: 0.5 mL via INTRAMUSCULAR
  Filled 2014-05-15: qty 0.5

## 2014-05-15 MED ORDER — SODIUM CHLORIDE 0.9 % IV BOLUS (SEPSIS)
1000.0000 mL | Freq: Once | INTRAVENOUS | Status: AC
  Administered 2014-05-15: 1000 mL via INTRAVENOUS

## 2014-05-15 MED ORDER — LEVOFLOXACIN IN D5W 750 MG/150ML IV SOLN
750.0000 mg | Freq: Once | INTRAVENOUS | Status: AC
  Administered 2014-05-15: 750 mg via INTRAVENOUS
  Filled 2014-05-15: qty 150

## 2014-05-15 MED ORDER — HEPARIN SODIUM (PORCINE) 5000 UNIT/ML IJ SOLN
5000.0000 [IU] | Freq: Three times a day (TID) | INTRAMUSCULAR | Status: DC
  Administered 2014-05-16 – 2014-05-18 (×6): 5000 [IU] via SUBCUTANEOUS
  Filled 2014-05-15 (×9): qty 1

## 2014-05-15 MED ORDER — IOHEXOL 300 MG/ML  SOLN
25.0000 mL | Freq: Once | INTRAMUSCULAR | Status: AC | PRN
  Administered 2014-05-15: 25 mL via ORAL

## 2014-05-15 NOTE — ED Provider Notes (Signed)
CSN: [id number]     Arrival date & time 05/15/14  1636 History   First MD Initiated Contact with Patient 05/15/14 1647     Chief Complaint  Patient presents with  . Diarrhea     (Consider location/radiation/quality/duration/timing/severity/associated sxs/prior Treatment) HPI Comments: 44 year old male with a history of hypertension and bipolar disorder presenting with weakness, diarrhea and nausea 3 days. Patient states he is feeling tired, has had multiple episodes of nonbloody diarrhea daily. Denies abdominal pain. No vomiting. Denies fevers. Admits to associated productive cough with phlegm. States his mouth feels dry. He is concerned that he took too much TheraFlu which is causing the diarrhea. TheraFlu did not help his symptoms. He has no appetite. No recent antibiotic use.  Patient is a 44 y.o. male presenting with diarrhea. The history is provided by the patient.  Diarrhea   Past Medical History  Diagnosis Date  . Hypertension   . Bipolar 1 disorder    History reviewed. No pertinent past surgical history. Family History  Problem Relation Age of Onset  . Diabetes Mother   . Hypertension Mother   . Diabetes Father   . Hypertension Father   . Hypertension Sister    History  Substance Use Topics  . Smoking status: Never Smoker   . Smokeless tobacco: Not on file  . Alcohol Use: No    Review of Systems  Constitutional: Positive for appetite change and fatigue.  Respiratory: Positive for cough.   Gastrointestinal: Positive for nausea and diarrhea.  Neurological: Positive for weakness.  All other systems reviewed and are negative.     Allergies  Codeine and Hydrocodone-acetaminophen  Home Medications   Prior to Admission medications   Medication Sig Start Date End Date Taking? Authorizing Provider  hydrOXYzine (ATARAX/VISTARIL) 25 MG tablet TAKE 1/2 TO 1  TABLETS (12.5 MG TO 25 MG TOTAL) BY MOUTH EVERY 8  HOURS AS NEEDED FOR ANXIETY. 11/25/12   Shawnee Knapp, MD   lisinopril-hydrochlorothiazide (PRINZIDE,ZESTORETIC) 20-25 MG per tablet Take 1 tablet by mouth daily. PATIENT NEEDS AN OFFICE VISIT FOR ADDITIONAL REFILLS. 07/19/13   Shawnee Knapp, MD  lithium carbonate 150 MG capsule Take 150 mg by mouth 3 (three) times daily with meals.    Historical Provider, MD  meclizine (ANTIVERT) 25 MG tablet Take one half to one about every 6 hours as needed for dizziness. It may cause drowsiness 04/22/13   Posey Boyer, MD  ranitidine (ZANTAC) 150 MG tablet Take 1 tablet (150 mg total) by mouth 2 (two) times daily. For heartburn and reflux. 10/22/12   Shawnee Knapp, MD  traZODone (DESYREL) 150 MG tablet Take 150 mg by mouth at bedtime.    Historical Provider, MD   BP 124/81 mmHg  Pulse 102  Temp(Src) 98.8 F (37.1 C) (Oral)  Resp 24  SpO2 90% Physical Exam  Constitutional: He is oriented to person, place, and time. He appears well-developed and well-nourished. He appears ill. No distress.  HENT:  Head: Normocephalic and atraumatic.  Dry MM.  Eyes: Conjunctivae are normal.  Neck: Normal range of motion. Neck supple.  Cardiovascular: Regular rhythm and normal heart sounds.   Mild tachy.  Pulmonary/Chest: Effort normal and breath sounds normal.  Abdominal: Soft. Bowel sounds are normal. He exhibits no distension. There is tenderness. There is no rigidity, no rebound and no guarding.  Generalized abdominal tenderness. No peritoneal signs.  Musculoskeletal: Normal range of motion. He exhibits no edema.  Neurological: He is alert and oriented to person,  place, and time.  Skin: Skin is warm and dry. He is not diaphoretic.  Psychiatric: He has a normal mood and affect. His behavior is normal.  Nursing note and vitals reviewed.   ED Course  Procedures (including critical care time) Labs Review Labs Reviewed  CBC - Abnormal; Notable for the following:    RBC 6.31 (*)    Hemoglobin 19.8 (*)    HCT 56.0 (*)    All other components within normal limits  COMPREHENSIVE  METABOLIC PANEL - Abnormal; Notable for the following:    Sodium 129 (*)    Chloride 93 (*)    Glucose, Bld 152 (*)    Creatinine, Ser 1.54 (*)    Calcium 8.2 (*)    AST 228 (*)    ALT 70 (*)    GFR calc non Af Amer 54 (*)    GFR calc Af Amer 63 (*)    All other components within normal limits  URINALYSIS, ROUTINE W REFLEX MICROSCOPIC - Abnormal; Notable for the following:    Hgb urine dipstick LARGE (*)    Ketones, ur 15 (*)    Protein, ur 100 (*)    All other components within normal limits  LIPASE, BLOOD - Abnormal; Notable for the following:    Lipase 86 (*)    All other components within normal limits  URINE MICROSCOPIC-ADD ON - Abnormal; Notable for the following:    Bacteria, UA FEW (*)    Casts HYALINE CASTS (*)    All other components within normal limits  CBG MONITORING, ED - Abnormal; Notable for the following:    Glucose-Capillary 137 (*)    All other components within normal limits  CULTURE, BLOOD (ROUTINE X 2)  CULTURE, BLOOD (ROUTINE X 2)  CULTURE, EXPECTORATED SPUTUM-ASSESSMENT  URINE CULTURE  GI PATHOGEN PANEL BY PCR, STOOL    Imaging Review Dg Chest 2 View  05/15/2014   CLINICAL DATA:  Nausea, diarrhea and weakness for 3 days, history hypertension  EXAM: CHEST  2 VIEW  COMPARISON:  08/23/2012  FINDINGS: Enlargement of cardiac silhouette with pulmonary vascular congestion.  Mediastinal contours normal.  Bibasilar atelectasis.  Perihilar interstitial prominence increased since previous exam, cannot exclude minimal failure/edema.  No gross pleural effusion, segmental consolidation or pneumothorax.  Osseous structures unremarkable.  IMPRESSION: Enlargement of cardiac silhouette with pulmonary vascular congestion and questionable mild perihilar edema.  Bibasilar atelectasis.   Electronically Signed   By: Lavonia Dana M.D.   On: 05/15/2014 18:34   Ct Abdomen Pelvis W Contrast  05/15/2014   CLINICAL DATA:  Nausea, diarrhea, weakness  EXAM: CT ABDOMEN AND PELVIS WITH  CONTRAST  TECHNIQUE: Multidetector CT imaging of the abdomen and pelvis was performed using the standard protocol following bolus administration of intravenous contrast.  CONTRAST:  25mL OMNIPAQUE IOHEXOL 300 MG/ML SOLN, 59mL OMNIPAQUE IOHEXOL 300 MG/ML SOLN  COMPARISON:  None.  FINDINGS: Lung bases shows patchy infiltrate in right base posteriorly. There is patchy infiltrate with peripheral distribution bilateral lower lobe. Finding may be due to pneumonia or pulmonary edema. Clinical correlation is necessary.  Sagittal images of the spine shows significant disc space flattening at L5-S1 level. There is mild posterior spurring with mild disc bulge at L4-L5 level.  Significant fatty infiltration of the liver. No focal hepatic mass. The gallbladder is contracted. No calcified gallstones are noted within gallbladder. The pancreas, spleen and adrenal glands are unremarkable. Kidneys are symmetrical in size and enhancement. There is a partially solid lesion with mixed cysts  or necrotic component in lower pole of the left kidney measures 2.5 x 2.2 cm. Correlation with urology exam and further evaluation with MRI is recommended.  No aortic aneurysm. No small bowel obstruction. Normal appendix. No pericecal inflammation. Liquid stool noted in left colon and rectosigmoid colon suspicious for diarrhea. Prostate gland and seminal vesicles are unremarkable. The urinary bladder is unremarkable.  IMPRESSION: 1. Significant fatty infiltration of the liver. There is patchy infiltrate with central distribution in right lung base. Patchy peripheral infiltrate bilateral lower lobe suspicious for pneumonia or pulmonary edema. Clinical correlation is necessary. 2. There is partially solid lesion in lower pole of the left kidney measures 2.5 x 2.2 cm. This is highly suspicious for renal cell carcinoma. Correlation with urology exam and further evolution with MRI is recommended. 3. Normal appendix.  No pericecal inflammation. 4. No  hydronephrosis or hydroureter. 5. Liquid stool noted in distal colon suspicious for diarrhea. These results were called by telephone at the time of interpretation on 05/15/2014 at 8:39 pm to Dr. Lucien Mons , who verbally acknowledged these results.   Electronically Signed   By: Lahoma Crocker M.D.   On: 05/15/2014 20:40     EKG Interpretation None      MDM   Final diagnoses:  CAP (community acquired pneumonia)  Left kidney mass  Acute renal failure, unspecified acute renal failure type  Hyponatremia  Dehydration   Patient ill-appearing but in no apparent distress. Afebrile, mildly tachycardic. Blood pressure stable. Mildly hypoxic at 93% on room air. Patient has a harsh sounding cough. Appears dry. 2 fluid boluses given. Labs showing large amount of blood in urine, few bacteria, 3-6 white blood cells. Asymptomatic. Urine culture pending. No leukocytosis. hyponatremic, elevated creatinine, hemoglobin 19.8. Consistent with patient being dry. LFTs elevated along with lipase. Concern for possible pancreatitis or liver pathology. CT scan obtained, results as shown above. I discussed these findings with patient and he will be admitted for dehydration, pneumonia and kidney failure. Will start patient on Levaquin. Stool panel pending. Admission accepted by Dr. Maudie Mercury, [facility], tele bed, obs at [facility].  Discussed with attending Dr. Stark Jock who agrees with plan of care.   Carman Ching, PA-C 05/15/14 2137  Veryl Speak, MD 05/16/14 [phone]

## 2014-05-15 NOTE — ED Notes (Signed)
Patient transported to CT 

## 2014-05-15 NOTE — ED Notes (Signed)
Pt having nausea and diarrhea with weakness x3 days.

## 2014-05-15 NOTE — Progress Notes (Signed)
[facility] arrived from [facility] manager text paged. Cassey Hurrell, Wonda Cheng, Therapist, sports

## 2014-05-16 ENCOUNTER — Inpatient Hospital Stay (HOSPITAL_COMMUNITY): Payer: BLUE CROSS/BLUE SHIELD

## 2014-05-16 DIAGNOSIS — R740 Nonspecific elevation of levels of transaminase and lactic acid dehydrogenase [LDH]: Secondary | ICD-10-CM

## 2014-05-16 DIAGNOSIS — R112 Nausea with vomiting, unspecified: Secondary | ICD-10-CM

## 2014-05-16 DIAGNOSIS — K760 Fatty (change of) liver, not elsewhere classified: Secondary | ICD-10-CM | POA: Diagnosis present

## 2014-05-16 DIAGNOSIS — R05 Cough: Secondary | ICD-10-CM | POA: Diagnosis present

## 2014-05-16 DIAGNOSIS — J189 Pneumonia, unspecified organism: Secondary | ICD-10-CM | POA: Diagnosis present

## 2014-05-16 DIAGNOSIS — E860 Dehydration: Secondary | ICD-10-CM

## 2014-05-16 DIAGNOSIS — I10 Essential (primary) hypertension: Secondary | ICD-10-CM

## 2014-05-16 DIAGNOSIS — N2889 Other specified disorders of kidney and ureter: Secondary | ICD-10-CM

## 2014-05-16 DIAGNOSIS — N289 Disorder of kidney and ureter, unspecified: Secondary | ICD-10-CM

## 2014-05-16 DIAGNOSIS — B349 Viral infection, unspecified: Secondary | ICD-10-CM | POA: Diagnosis present

## 2014-05-16 DIAGNOSIS — N179 Acute kidney failure, unspecified: Secondary | ICD-10-CM

## 2014-05-16 DIAGNOSIS — Z8249 Family history of ischemic heart disease and other diseases of the circulatory system: Secondary | ICD-10-CM | POA: Diagnosis not present

## 2014-05-16 DIAGNOSIS — R197 Diarrhea, unspecified: Secondary | ICD-10-CM

## 2014-05-16 DIAGNOSIS — Z833 Family history of diabetes mellitus: Secondary | ICD-10-CM | POA: Diagnosis not present

## 2014-05-16 DIAGNOSIS — R319 Hematuria, unspecified: Secondary | ICD-10-CM | POA: Diagnosis present

## 2014-05-16 DIAGNOSIS — F319 Bipolar disorder, unspecified: Secondary | ICD-10-CM | POA: Diagnosis present

## 2014-05-16 DIAGNOSIS — Z79899 Other long term (current) drug therapy: Secondary | ICD-10-CM | POA: Diagnosis not present

## 2014-05-16 DIAGNOSIS — Z885 Allergy status to narcotic agent status: Secondary | ICD-10-CM | POA: Diagnosis not present

## 2014-05-16 LAB — EXPECTORATED SPUTUM ASSESSMENT W REFEX TO RESP CULTURE

## 2014-05-16 LAB — CBC
HCT: 49.9 % (ref 39.0–52.0)
HEMOGLOBIN: 17.4 g/dL — AB (ref 13.0–17.0)
MCH: 31.4 pg (ref 26.0–34.0)
MCHC: 34.9 g/dL (ref 30.0–36.0)
MCV: 89.9 fL (ref 78.0–100.0)
Platelets: 140 10*3/uL — ABNORMAL LOW (ref 150–400)
RBC: 5.55 MIL/uL (ref 4.22–5.81)
RDW: 13.6 % (ref 11.5–15.5)
WBC: 4.3 10*3/uL (ref 4.0–10.5)

## 2014-05-16 LAB — COMPREHENSIVE METABOLIC PANEL
ALT: 65 U/L — AB (ref 0–53)
AST: 194 U/L — ABNORMAL HIGH (ref 0–37)
Albumin: 2.7 g/dL — ABNORMAL LOW (ref 3.5–5.2)
Alkaline Phosphatase: 93 U/L (ref 39–117)
Anion gap: 9 (ref 5–15)
BILIRUBIN TOTAL: 1 mg/dL (ref 0.3–1.2)
BUN: 18 mg/dL (ref 6–23)
CALCIUM: 8.7 mg/dL (ref 8.4–10.5)
CO2: 22 mmol/L (ref 19–32)
Chloride: 107 mmol/L (ref 96–112)
Creatinine, Ser: 1.31 mg/dL (ref 0.50–1.35)
GFR, EST AFRICAN AMERICAN: 76 mL/min — AB (ref >90)
GFR, EST NON AFRICAN AMERICAN: 66 mL/min — AB (ref >90)
Glucose, Bld: 115 mg/dL — ABNORMAL HIGH (ref 70–99)
Potassium: 3.5 mmol/L (ref 3.5–5.1)
SODIUM: 138 mmol/L (ref 135–145)
Total Protein: 6.1 g/dL (ref 6.0–8.3)

## 2014-05-16 LAB — EXPECTORATED SPUTUM ASSESSMENT W GRAM STAIN, RFLX TO RESP C

## 2014-05-16 MED ORDER — WHITE PETROLATUM GEL
Status: AC
  Administered 2014-05-16: 0.2
  Filled 2014-05-16: qty 1

## 2014-05-16 MED ORDER — IPRATROPIUM-ALBUTEROL 0.5-2.5 (3) MG/3ML IN SOLN
3.0000 mL | Freq: Four times a day (QID) | RESPIRATORY_TRACT | Status: DC
  Administered 2014-05-16 – 2014-05-17 (×4): 3 mL via RESPIRATORY_TRACT
  Filled 2014-05-16 (×4): qty 3

## 2014-05-16 MED ORDER — ONDANSETRON HCL 4 MG/2ML IJ SOLN: 4.0000 mg | Freq: Four times a day (QID) | INTRAMUSCULAR | Status: DC | PRN

## 2014-05-16 MED ORDER — SODIUM CHLORIDE 0.9 % IV SOLN
INTRAVENOUS | Status: DC
  Administered 2014-05-16 – 2014-05-17 (×2): via INTRAVENOUS

## 2014-05-16 MED ORDER — GADOBENATE DIMEGLUMINE 529 MG/ML IV SOLN
20.0000 mL | Freq: Once | INTRAVENOUS | Status: AC | PRN
  Administered 2014-05-16: 20 mL via INTRAVENOUS

## 2014-05-16 MED ORDER — LEVOFLOXACIN IN D5W 750 MG/150ML IV SOLN
750.0000 mg | INTRAVENOUS | Status: DC
  Administered 2014-05-16 – 2014-05-17 (×2): 750 mg via INTRAVENOUS
  Filled 2014-05-16 (×3): qty 150

## 2014-05-16 MED ORDER — METHYLPREDNISOLONE SODIUM SUCC 125 MG IJ SOLR
60.0000 mg | Freq: Every day | INTRAMUSCULAR | Status: DC
  Administered 2014-05-16 – 2014-05-18 (×3): 60 mg via INTRAVENOUS
  Filled 2014-05-16: qty 2
  Filled 2014-05-16 (×2): qty 0.96

## 2014-05-16 MED ORDER — DM-GUAIFENESIN ER 30-600 MG PO TB12
1.0000 | ORAL_TABLET | Freq: Two times a day (BID) | ORAL | Status: DC
  Administered 2014-05-16 – 2014-05-18 (×5): 1 via ORAL
  Filled 2014-05-16 (×6): qty 1

## 2014-05-16 NOTE — H&P (Signed)
[facility] History and Physical  Atilano Covelli YIR:[id number] DOB: 1970-09-18 DOA: 05/15/2014  Referring physician: EDP PCP: Delman Cheadle, MD   Chief Complaint: Cough, diarrhea   HPI: [name] is a 44 y.o. male who presents to the ED with 3 day history of N/D, fatigue, cough.  No abdominal pain.  Theraflu did not help symtpoms.  Cough is productive with phlegm.  Mouth feels dry.  Review of Systems: Systems reviewed.  As above, otherwise negative  Past Medical History  Diagnosis Date  . Hypertension   . Bipolar 1 disorder    History reviewed. No pertinent past surgical history. Social History:  reports that he has never smoked. He does not have any smokeless tobacco history on file. He reports that he does not drink alcohol or use illicit drugs.  Allergies  Allergen Reactions  . Codeine   . Hydrocodone-Acetaminophen     Family History  Problem Relation Age of Onset  . Diabetes Mother   . Hypertension Mother   . Diabetes Father   . Hypertension Father   . Hypertension Sister      Prior to Admission medications   Medication Sig Start Date End Date Taking? Authorizing Provider  hydrOXYzine (ATARAX/VISTARIL) 25 MG tablet TAKE 1/2 TO 1  TABLETS (12.5 MG TO 25 MG TOTAL) BY MOUTH EVERY 8  HOURS AS NEEDED FOR ANXIETY. 11/25/12   Shawnee Knapp, MD  lisinopril-hydrochlorothiazide (PRINZIDE,ZESTORETIC) 20-25 MG per tablet Take 1 tablet by mouth daily. PATIENT NEEDS AN OFFICE VISIT FOR ADDITIONAL REFILLS. 07/19/13   Shawnee Knapp, MD  lithium carbonate 150 MG capsule Take 150 mg by mouth 3 (three) times daily with meals.    Historical Provider, MD  meclizine (ANTIVERT) 25 MG tablet Take one half to one about every 6 hours as needed for dizziness. It may cause drowsiness 04/22/13   Posey Boyer, MD  ranitidine (ZANTAC) 150 MG tablet Take 1 tablet (150 mg total) by mouth 2 (two) times daily. For heartburn and reflux. 10/22/12   Shawnee Knapp, MD  traZODone (DESYREL) 150 MG tablet Take 150 mg by  mouth at bedtime.    Historical Provider, MD   Physical Exam: Filed Vitals:   05/15/14 2254  BP: 120/82  Pulse: 101  Temp: 98.9 F (37.2 C)  Resp: 19    BP 120/82 mmHg  Pulse 101  Temp(Src) 98.9 F (37.2 C) (Oral)  Resp 19  Ht 5\' 8"  (1.727 m)  Wt 108.183 kg (238 lb 8 oz)  BMI 36.27 kg/m2  SpO2 98%  General Appearance:    Alert, oriented, no distress, appears stated age  Head:    Normocephalic, atraumatic  Eyes:    PERRL, EOMI, sclera non-icteric        Nose:   Nares without drainage or epistaxis. Mucosa, turbinates normal  Throat:   Moist mucous membranes. Oropharynx without erythema or exudate.  Neck:   Supple. No carotid bruits.  No thyromegaly.  No lymphadenopathy.   Back:     No CVA tenderness, no spinal tenderness  Lungs:     Clear to auscultation bilaterally, without wheezes, rhonchi or rales  Chest wall:    No tenderness to palpitation  Heart:    Regular rate and rhythm without murmurs, gallops, rubs  Abdomen:     Soft, non-tender, nondistended, normal bowel sounds, no organomegaly  Genitalia:    deferred  Rectal:    deferred  Extremities:   No clubbing, cyanosis or edema.  Pulses:   2+ and  symmetric all extremities  Skin:   Skin color, texture, turgor normal, no rashes or lesions  Lymph nodes:   Cervical, supraclavicular, and axillary nodes normal  Neurologic:   CNII-XII intact. Normal strength, sensation and reflexes      throughout    Labs on Admission:  Basic Metabolic Panel:  Recent Labs Lab 05/15/14 1734  NA 129*  K 3.6  CL 93*  CO2 28  GLUCOSE 152*  BUN 22  CREATININE 1.54*  CALCIUM 8.2*   Liver Function Tests:  Recent Labs Lab 05/15/14 1734  AST 228*  ALT 70*  ALKPHOS 111  BILITOT 1.1  PROT 7.3  ALBUMIN 3.5    Recent Labs Lab 05/15/14 1734  LIPASE 86*   No results for input(s): AMMONIA in the last 168 hours. CBC:  Recent Labs Lab 05/15/14 1734  WBC 4.8  HGB 19.8*  HCT 56.0*  MCV 88.7  PLT 161   Cardiac  Enzymes: No results for input(s): CKTOTAL, CKMB, CKMBINDEX, TROPONINI in the last 168 hours.  BNP (last 3 results) No results for input(s): PROBNP in the last 8760 hours. CBG:  Recent Labs Lab 05/15/14 1734  GLUCAP 137*    Radiological Exams on Admission: Dg Chest 2 View  05/15/2014   CLINICAL DATA:  Nausea, diarrhea and weakness for 3 days, history hypertension  EXAM: CHEST  2 VIEW  COMPARISON:  08/23/2012  FINDINGS: Enlargement of cardiac silhouette with pulmonary vascular congestion.  Mediastinal contours normal.  Bibasilar atelectasis.  Perihilar interstitial prominence increased since previous exam, cannot exclude minimal failure/edema.  No gross pleural effusion, segmental consolidation or pneumothorax.  Osseous structures unremarkable.  IMPRESSION: Enlargement of cardiac silhouette with pulmonary vascular congestion and questionable mild perihilar edema.  Bibasilar atelectasis.   Electronically Signed   By: Lavonia Dana M.D.   On: 05/15/2014 18:34   Ct Abdomen Pelvis W Contrast  05/15/2014   CLINICAL DATA:  Nausea, diarrhea, weakness  EXAM: CT ABDOMEN AND PELVIS WITH CONTRAST  TECHNIQUE: Multidetector CT imaging of the abdomen and pelvis was performed using the standard protocol following bolus administration of intravenous contrast.  CONTRAST:  68mL OMNIPAQUE IOHEXOL 300 MG/ML SOLN, 79mL OMNIPAQUE IOHEXOL 300 MG/ML SOLN  COMPARISON:  None.  FINDINGS: Lung bases shows patchy infiltrate in right base posteriorly. There is patchy infiltrate with peripheral distribution bilateral lower lobe. Finding may be due to pneumonia or pulmonary edema. Clinical correlation is necessary.  Sagittal images of the spine shows significant disc space flattening at L5-S1 level. There is mild posterior spurring with mild disc bulge at L4-L5 level.  Significant fatty infiltration of the liver. No focal hepatic mass. The gallbladder is contracted. No calcified gallstones are noted within gallbladder. The pancreas,  spleen and adrenal glands are unremarkable. Kidneys are symmetrical in size and enhancement. There is a partially solid lesion with mixed cysts or necrotic component in lower pole of the left kidney measures 2.5 x 2.2 cm. Correlation with urology exam and further evaluation with MRI is recommended.  No aortic aneurysm. No small bowel obstruction. Normal appendix. No pericecal inflammation. Liquid stool noted in left colon and rectosigmoid colon suspicious for diarrhea. Prostate gland and seminal vesicles are unremarkable. The urinary bladder is unremarkable.  IMPRESSION: 1. Significant fatty infiltration of the liver. There is patchy infiltrate with central distribution in right lung base. Patchy peripheral infiltrate bilateral lower lobe suspicious for pneumonia or pulmonary edema. Clinical correlation is necessary. 2. There is partially solid lesion in lower pole of the left kidney measures  2.5 x 2.2 cm. This is highly suspicious for renal cell carcinoma. Correlation with urology exam and further evolution with MRI is recommended. 3. Normal appendix.  No pericecal inflammation. 4. No hydronephrosis or hydroureter. 5. Liquid stool noted in distal colon suspicious for diarrhea. These results were called by telephone at the time of interpretation on 05/15/2014 at 8:39 pm to Dr. Lucien Mons , who verbally acknowledged these results.   Electronically Signed   By: Lahoma Crocker M.D.   On: 05/15/2014 20:40    EKG: Independently reviewed.  Assessment/Plan Principal Problem:   CAP (community acquired pneumonia) Active Problems:   Hypertension   Nausea vomiting and diarrhea   Mass of left kidney   Transaminitis   Acute renal insufficiency   1. CAP - BLL cap demonstrated on CT chest 1. Levaquin for empiric treatment, however suspect that he most likely has a viral syndrome given the transaminitis, N/V/D 2. BCX and sputum CX pending 2. N/V/D - 1. Clear liquid diet, advance as tolerated 2. zofran for  nausea 3. Transaminitis 1. Repeat CMP tomorrow AM 2. Likely a result of the viral syndrome causing his symptoms 4. Acute renal insufficiency - creatinine up to 1.5 from 1.0.  Likely dehydration as evidenced by his hemoconcentration with HGB of 19. 1. IVF 2. Hold lisinopril-hctz 3. Recheck CMP in AM 4. Intake and output 5. L kidney mass - need urology evaluation following resolution of acute issues above.    Code Status: Full Code  Family Communication: No family in room Disposition Plan: Admit to inpatient   Time spent: 70 min  GARDNER, JARED M. [facility] Pager [phone]  If 7AM-7PM, please contact the day team taking care of the patient [email or web address] Password [email or web address] 05/16/2014, 12:12 AM

## 2014-05-16 NOTE — Progress Notes (Signed)
Utilization Review completed.  

## 2014-05-16 NOTE — Progress Notes (Signed)
[facility] PROGRESS NOTE  [name] STM:[id number] DOB: 01/20/1971 DOA: 05/15/2014 PCP: Delman Cheadle, MD  Assessment/Plan:  CAP - BLL cap demonstrated on CT chest -Continue Levaquin for empiric treatment; most likely has a viral syndrome given the transaminitis, -Solu-Medrol 60 mg daily -DuoNeb QID -Mucinex DM BID -Flutter valve -Respiratory viral panel pending -Sputum culture pending   N/V/D  -C. difficile pending -Stool culture pending -Clear liquid diet, advance as tolerated- zofran for nausea  Essential hypertension -Hold all BP medication secondary soft BP  Transaminitis -Shock liver? Viral syndrome?  -Follow liver enzymes   Acute renal failure(baseline 1.0) -Continue hydration normal saline 12ml/hr -Continue hold lisinopril-hctz -Strict in and out  Lt kidney mass -Suspicious for renal cell carcinoma; obtain MRI abdomen    Code Status: Full  Family Communication: None Disposition Plan: Resolution CAP   Consultants: NA   Procedures: 2/19 CT abdomen pelvis with contrast;-fatty infiltration of the liver.  - patchy infiltrate with central distribution in right lung base/ Patchy peripheral infiltrate bilateral lower lobe suspicious for pneumonia or pulmonary edema.  -Partially solid lesion in lower pole of the left kidney measures 2.5 x 2.2 cm. This is highly suspicious for renal cell carcinoma.  3. Normal appendix. No pericecal inflammation. 4. No hydronephrosis or hydroureter. 5. Liquid stool noted in distal colon suspicious for diarrhea.     Cultures 2/20 Respiratory viral panel pending 2/20 Sputum culture pending   Antibiotics: Levofloxacin 2/20>>   DVT prophylaxis Subcutaneous heparin   HPI/Subjective: [name] is a 44 y.o. BM PMHx  bipolar 1 disorder, HTN, male who presents to the ED with 3 day history of N/D, fatigue, cough. No abdominal pain. Theraflu did not help symtpoms. Cough is productive with phlegm. Mouth feels  dry. 2/20 A/O 4, states had a productive cough (yellow), negative smoker, negative sick contacts. Currently positive SOB, negative CP.  Objective: Filed Vitals:   05/15/14 2206 05/15/14 2254 05/16/14 0627 05/16/14 1030  BP: 100/71 120/82 125/73 117/65  Pulse: 100 101 118 96  Temp:  98.9 F (37.2 C) 98 F (36.7 C) 98.4 F (36.9 C)  TempSrc:  Oral Oral Oral  Resp: 24 19 20 18   Height:  5\' 8"  (1.727 m)    Weight:  108.183 kg (238 lb 8 oz)    SpO2: 92% 98% 92% 92%    Intake/Output Summary (Last 24 hours) at 05/16/14 1503 Last data filed at 05/15/14 2300  Gross per 24 hour  Intake      0 ml  Output      1 ml  Net     -1 ml   Filed Weights   05/15/14 2254  Weight: 108.183 kg (238 lb 8 oz)     Exam: General: A/O 4, mild acute respiratory distress Lungs: diffuse decreased breath sounds with bibasilar crackles, diffuse expiratory wheezing Cardiovascular: Tachycardic, Regular rhythm without murmur gallop or rub normal S1 and S2 Abdomen: Nontender, nondistended, soft, bowel sounds positive, no rebound, no ascites, no appreciable mass Extremities: No significant cyanosis, clubbing, or edema bilateral lower extremities   Data Reviewed: Basic Metabolic Panel:  Recent Labs Lab 05/15/14 1734 05/16/14 0705  NA 129* 138  K 3.6 3.5  CL 93* 107  CO2 28 22  GLUCOSE 152* 115*  BUN 22 18  CREATININE 1.54* 1.31  CALCIUM 8.2* 8.7   Liver Function Tests:  Recent Labs Lab 05/15/14 1734 05/16/14 0705  AST 228* 194*  ALT 70* 65*  ALKPHOS 111 93  BILITOT 1.1 1.0  PROT 7.3  6.1  ALBUMIN 3.5 2.7*    Recent Labs Lab 05/15/14 1734  LIPASE 86*   No results for input(s): AMMONIA in the last 168 hours. CBC:  Recent Labs Lab 05/15/14 1734 05/16/14 0705  WBC 4.8 4.3  HGB 19.8* 17.4*  HCT 56.0* 49.9  MCV 88.7 89.9  PLT 161 140*   Cardiac Enzymes: No results for input(s): CKTOTAL, CKMB, CKMBINDEX, TROPONINI in the last 168 hours. BNP (last 3 results) No results for  input(s): BNP in the last 8760 hours.  ProBNP (last 3 results) No results for input(s): PROBNP in the last 8760 hours.  CBG:  Recent Labs Lab 05/15/14 1734  GLUCAP 137*    Recent Results (from the past 240 hour(s))  Culture, expectorated sputum-assessment     Status: None   Collection Time: 05/16/14 12:14 AM  Result Value Ref Range Status   Specimen Description SPUTUM  Final   Special Requests NONE  Final   Sputum evaluation   Final    MICROSCOPIC FINDINGS SUGGEST THAT THIS SPECIMEN IS NOT REPRESENTATIVE OF LOWER RESPIRATORY SECRETIONS. PLEASE RECOLLECT. CALLED TO Pecola Lawless, [place] 05/16/14 M.CAMPBELL    Report Status 05/16/2014 FINAL  Final     Studies: Dg Chest 2 View  05/15/2014   CLINICAL DATA:  Nausea, diarrhea and weakness for 3 days, history hypertension  EXAM: CHEST  2 VIEW  COMPARISON:  08/23/2012  FINDINGS: Enlargement of cardiac silhouette with pulmonary vascular congestion.  Mediastinal contours normal.  Bibasilar atelectasis.  Perihilar interstitial prominence increased since previous exam, cannot exclude minimal failure/edema.  No gross pleural effusion, segmental consolidation or pneumothorax.  Osseous structures unremarkable.  IMPRESSION: Enlargement of cardiac silhouette with pulmonary vascular congestion and questionable mild perihilar edema.  Bibasilar atelectasis.   Electronically Signed   By: Lavonia Dana M.D.   On: 05/15/2014 18:34   Ct Abdomen Pelvis W Contrast  05/15/2014   CLINICAL DATA:  Nausea, diarrhea, weakness  EXAM: CT ABDOMEN AND PELVIS WITH CONTRAST  TECHNIQUE: Multidetector CT imaging of the abdomen and pelvis was performed using the standard protocol following bolus administration of intravenous contrast.  CONTRAST:  45mL OMNIPAQUE IOHEXOL 300 MG/ML SOLN, 41mL OMNIPAQUE IOHEXOL 300 MG/ML SOLN  COMPARISON:  None.  FINDINGS: Lung bases shows patchy infiltrate in right base posteriorly. There is patchy infiltrate with peripheral distribution bilateral  lower lobe. Finding may be due to pneumonia or pulmonary edema. Clinical correlation is necessary.  Sagittal images of the spine shows significant disc space flattening at L5-S1 level. There is mild posterior spurring with mild disc bulge at L4-L5 level.  Significant fatty infiltration of the liver. No focal hepatic mass. The gallbladder is contracted. No calcified gallstones are noted within gallbladder. The pancreas, spleen and adrenal glands are unremarkable. Kidneys are symmetrical in size and enhancement. There is a partially solid lesion with mixed cysts or necrotic component in lower pole of the left kidney measures 2.5 x 2.2 cm. Correlation with urology exam and further evaluation with MRI is recommended.  No aortic aneurysm. No small bowel obstruction. Normal appendix. No pericecal inflammation. Liquid stool noted in left colon and rectosigmoid colon suspicious for diarrhea. Prostate gland and seminal vesicles are unremarkable. The urinary bladder is unremarkable.  IMPRESSION: 1. Significant fatty infiltration of the liver. There is patchy infiltrate with central distribution in right lung base. Patchy peripheral infiltrate bilateral lower lobe suspicious for pneumonia or pulmonary edema. Clinical correlation is necessary. 2. There is partially solid lesion in lower pole of the left kidney measures  2.5 x 2.2 cm. This is highly suspicious for renal cell carcinoma. Correlation with urology exam and further evolution with MRI is recommended. 3. Normal appendix.  No pericecal inflammation. 4. No hydronephrosis or hydroureter. 5. Liquid stool noted in distal colon suspicious for diarrhea. These results were called by telephone at the time of interpretation on 05/15/2014 at 8:39 pm to Dr. Lucien Mons , who verbally acknowledged these results.   Electronically Signed   By: Lahoma Crocker M.D.   On: 05/15/2014 20:40    Scheduled Meds: . dextromethorphan-guaiFENesin  1 tablet Oral BID  . heparin  5,000 Units  Subcutaneous 3 times per day  . ipratropium-albuterol  3 mL Nebulization Q6H  . levofloxacin (LEVAQUIN) IV  750 mg Intravenous Q24H  . methylPREDNISolone (SOLU-MEDROL) injection  60 mg Intravenous Daily   Continuous Infusions: . sodium chloride      Principal Problem:   CAP (community acquired pneumonia) Active Problems:   Hypertension   Nausea vomiting and diarrhea   Mass of left kidney   Transaminitis   Acute renal insufficiency   Acute renal failure syndrome   Dehydration   Left kidney mass    Time spent: 40 min   WOODS, CURTIS, J  [facility] Pager [phone]. If 7PM-7AM, please contact night-coverage at [email or web address], password [email or web address] 05/16/2014, 3:03 PM  LOS: 0 days    Care during the described time interval was provided by me .  I have reviewed this patient's available data, including medical history, events of note, physical examination, radiology studies and test results as part of my evaluation  Dia Crawford, MD [phone] Pager

## 2014-05-17 LAB — CBC WITH DIFFERENTIAL/PLATELET
Band Neutrophils: 0 % (ref 0–10)
Basophils Absolute: 0 10*3/uL (ref 0.0–0.1)
Basophils Relative: 0 % (ref 0–1)
Blasts: 0 %
EOS PCT: 0 % (ref 0–5)
Eosinophils Absolute: 0 10*3/uL (ref 0.0–0.7)
HEMATOCRIT: 48.5 % (ref 39.0–52.0)
HEMOGLOBIN: 17 g/dL (ref 13.0–17.0)
Lymphocytes Relative: 33 % (ref 12–46)
Lymphs Abs: 0.8 10*3/uL (ref 0.7–4.0)
MCH: 31.4 pg (ref 26.0–34.0)
MCHC: 35.1 g/dL (ref 30.0–36.0)
MCV: 89.5 fL (ref 78.0–100.0)
MYELOCYTES: 0 %
Metamyelocytes Relative: 0 %
Monocytes Absolute: 0.2 10*3/uL (ref 0.1–1.0)
Monocytes Relative: 7 % (ref 3–12)
NEUTROS PCT: 60 % (ref 43–77)
Neutro Abs: 1.5 10*3/uL — ABNORMAL LOW (ref 1.7–7.7)
Platelets: 135 10*3/uL — ABNORMAL LOW (ref 150–400)
Promyelocytes Absolute: 0 %
RBC: 5.42 MIL/uL (ref 4.22–5.81)
RDW: 13.7 % (ref 11.5–15.5)
WBC: 2.5 10*3/uL — AB (ref 4.0–10.5)
nRBC: 0 /100 WBC

## 2014-05-17 LAB — COMPREHENSIVE METABOLIC PANEL
ALBUMIN: 2.8 g/dL — AB (ref 3.5–5.2)
ALT: 61 U/L — AB (ref 0–53)
AST: 139 U/L — ABNORMAL HIGH (ref 0–37)
Alkaline Phosphatase: 81 U/L (ref 39–117)
Anion gap: 9 (ref 5–15)
BILIRUBIN TOTAL: 1.1 mg/dL (ref 0.3–1.2)
BUN: 14 mg/dL (ref 6–23)
CO2: 23 mmol/L (ref 19–32)
Calcium: 8.6 mg/dL (ref 8.4–10.5)
Chloride: 106 mmol/L (ref 96–112)
Creatinine, Ser: 1.1 mg/dL (ref 0.50–1.35)
GFR calc Af Amer: >90 mL/min (ref >90)
GFR calc non Af Amer: 81 mL/min — ABNORMAL LOW (ref >90)
GLUCOSE: 140 mg/dL — AB (ref 70–99)
Potassium: 3.5 mmol/L (ref 3.5–5.1)
SODIUM: 138 mmol/L (ref 135–145)
TOTAL PROTEIN: 6.1 g/dL (ref 6.0–8.3)

## 2014-05-17 LAB — URINE CULTURE

## 2014-05-17 MED ORDER — IPRATROPIUM-ALBUTEROL 0.5-2.5 (3) MG/3ML IN SOLN: 3.0000 mL | Freq: Four times a day (QID) | RESPIRATORY_TRACT | Status: DC | PRN

## 2014-05-17 NOTE — Progress Notes (Signed)
[facility] PROGRESS NOTE  [name] LKG:[id number] DOB: 01/14/1971 DOA: 05/15/2014 PCP: Delman Cheadle, MD  Assessment/Plan:  CAP - BLL cap demonstrated on CT chest -Continue Levaquin for empiric treatment; most likely has a viral syndrome given the transaminitis, -Solu-Medrol 60 mg daily -DuoNeb QID -Mucinex DM BID -Flutter valve -Respiratory viral panel pending -Sputum culture pending   N/V/D  -Increase diet to full liquids  -Continue zofran for nausea -Patient's diarrhea has resolved in fact no BM overnight at all DC enteric precautions  Essential hypertension -Hold all BP medication secondary soft BP  Transaminitis -Shock liver? Viral syndrome?  -Improving with hydration  -Continue to follow liver enzymes   Acute renal failure(baseline 1.0) -Continue hydration normal saline 145ml/hr, patient close to baseline Cr -Continue hold lisinopril-hctz -Strict in and out  Lt kidney mass -Suspicious for renal cell carcinoma; obtain MRI abdomen to my: Once results of MRI have returned will most likely contact nephrology and general surgery.     Code Status: Full  Family Communication: None Disposition Plan: Resolution CAP   Consultants: NA   Procedures: 2/19 CT abdomen pelvis with contrast;-fatty infiltration of the liver.  - patchy infiltrate with central distribution in right lung base/ Patchy peripheral infiltrate bilateral lower lobe suspicious for pneumonia or pulmonary edema.  -Partially solid lesion in lower pole of the left kidney measures 2.5 x 2.2 cm. This is highly suspicious for renal cell carcinoma.  3. Normal appendix. No pericecal inflammation. 4. No hydronephrosis or hydroureter. 5. Liquid stool noted in distal colon suspicious for diarrhea.     Cultures 2/20 Respiratory viral panel pending 2/20 Sputum culture pending   Antibiotics: Levofloxacin 2/20>>   DVT prophylaxis Subcutaneous heparin   HPI/Subjective: [name] is a 44 y.o.  BM PMHx  bipolar 1 disorder, HTN, male who presents to the ED with 3 day history of N/D, fatigue, cough. No abdominal pain. Theraflu did not help symtpoms. Cough is productive with phlegm. Mouth feels dry. 2/20 A/O 4, states had a productive cough (yellow), negative smoker, negative sick contacts. Currently positive SOB, negative CP. 2/21 A/O 4, states negative N/V/D,  Objective: Filed Vitals:   05/16/14 2025 05/16/14 2100 05/17/14 0221 05/17/14 0500  BP:  122/86  132/77  Pulse:  81  81  Temp:  98.3 F (36.8 C)  98.4 F (36.9 C)  TempSrc:  Oral  Oral  Resp:  22  20  Height:      Weight:  113.626 kg (250 lb 8 oz)    SpO2: 90% 97% 85% 90%   No intake or output data in the 24 hours ending 05/17/14 0845 Filed Weights   05/15/14 2254 05/16/14 2100  Weight: 108.183 kg (238 lb 8 oz) 113.626 kg (250 lb 8 oz)     Exam: General: A/O 4, mild acute respiratory distress Lungs: diffuse decreased breath sounds with bibasilar crackles, diffuse expiratory wheezing Cardiovascular:  Regular rhythm and rate,without murmur gallop or rub normal S1 and S2 Abdomen: Nontender, nondistended, soft, bowel sounds positive, no rebound, no ascites, no appreciable mass Extremities: No significant cyanosis, clubbing, or edema bilateral lower extremities   Data Reviewed: Basic Metabolic Panel:  Recent Labs Lab 05/15/14 1734 05/16/14 0705 05/17/14 0530  NA 129* 138 138  K 3.6 3.5 3.5  CL 93* 107 106  CO2 28 22 23   GLUCOSE 152* 115* 140*  BUN 22 18 14   CREATININE 1.54* 1.31 1.10  CALCIUM 8.2* 8.7 8.6   Liver Function Tests:  Recent Labs Lab 05/15/14 1734 05/16/14  6270 05/17/14 0530  AST 228* 194* 139*  ALT 70* 65* 61*  ALKPHOS 111 93 81  BILITOT 1.1 1.0 1.1  PROT 7.3 6.1 6.1  ALBUMIN 3.5 2.7* 2.8*    Recent Labs Lab 05/15/14 1734  LIPASE 86*   No results for input(s): AMMONIA in the last 168 hours. CBC:  Recent Labs Lab 05/15/14 1734 05/16/14 0705 05/17/14 0530  WBC  4.8 4.3 2.5*  NEUTROABS  --   --  1.5*  HGB 19.8* 17.4* 17.0  HCT 56.0* 49.9 48.5  MCV 88.7 89.9 89.5  PLT 161 140* 135*   Cardiac Enzymes: No results for input(s): CKTOTAL, CKMB, CKMBINDEX, TROPONINI in the last 168 hours. BNP (last 3 results) No results for input(s): BNP in the last 8760 hours.  ProBNP (last 3 results) No results for input(s): PROBNP in the last 8760 hours.  CBG:  Recent Labs Lab 05/15/14 1734  GLUCAP 137*    Recent Results (from the past 240 hour(s))  Blood culture (routine x 2)     Status: None (Preliminary result)   Collection Time: 05/15/14  9:55 PM  Result Value Ref Range Status   Specimen Description BLOOD RIGHT ANTECUBITAL  Final   Special Requests   Final    BOTTLES DRAWN AEROBIC AND ANAEROBIC AER 5.5cc ANA 5.5cc   Culture   Final           BLOOD CULTURE RECEIVED NO GROWTH TO DATE CULTURE WILL BE HELD FOR 5 DAYS BEFORE ISSUING A FINAL NEGATIVE REPORT Performed at [facility]    Report Status PENDING  Incomplete  Blood culture (routine x 2)     Status: None (Preliminary result)   Collection Time: 05/15/14 10:00 PM  Result Value Ref Range Status   Specimen Description BLOOD LEFT ANTECUBITAL  Final   Special Requests   Final    BOTTLES DRAWN AEROBIC AND ANAEROBIC AER 5cc ANA 5cc   Culture   Final           BLOOD CULTURE RECEIVED NO GROWTH TO DATE CULTURE WILL BE HELD FOR 5 DAYS BEFORE ISSUING A FINAL NEGATIVE REPORT Performed at [facility]    Report Status PENDING  Incomplete  Culture, expectorated sputum-assessment     Status: None   Collection Time: 05/16/14 12:14 AM  Result Value Ref Range Status   Specimen Description SPUTUM  Final   Special Requests NONE  Final   Sputum evaluation   Final    MICROSCOPIC FINDINGS SUGGEST THAT THIS SPECIMEN IS NOT REPRESENTATIVE OF LOWER RESPIRATORY SECRETIONS. PLEASE RECOLLECT. CALLED TO Pecola Lawless, [place] 05/16/14 M.CAMPBELL    Report Status 05/16/2014 FINAL  Final      Studies: Dg Chest 2 View  05/15/2014   CLINICAL DATA:  Nausea, diarrhea and weakness for 3 days, history hypertension  EXAM: CHEST  2 VIEW  COMPARISON:  08/23/2012  FINDINGS: Enlargement of cardiac silhouette with pulmonary vascular congestion.  Mediastinal contours normal.  Bibasilar atelectasis.  Perihilar interstitial prominence increased since previous exam, cannot exclude minimal failure/edema.  No gross pleural effusion, segmental consolidation or pneumothorax.  Osseous structures unremarkable.  IMPRESSION: Enlargement of cardiac silhouette with pulmonary vascular congestion and questionable mild perihilar edema.  Bibasilar atelectasis.   Electronically Signed   By: Lavonia Dana M.D.   On: 05/15/2014 18:34   Ct Abdomen Pelvis W Contrast  05/15/2014   CLINICAL DATA:  Nausea, diarrhea, weakness  EXAM: CT ABDOMEN AND PELVIS WITH CONTRAST  TECHNIQUE: Multidetector CT imaging of the  abdomen and pelvis was performed using the standard protocol following bolus administration of intravenous contrast.  CONTRAST:  7mL OMNIPAQUE IOHEXOL 300 MG/ML SOLN, 64mL OMNIPAQUE IOHEXOL 300 MG/ML SOLN  COMPARISON:  None.  FINDINGS: Lung bases shows patchy infiltrate in right base posteriorly. There is patchy infiltrate with peripheral distribution bilateral lower lobe. Finding may be due to pneumonia or pulmonary edema. Clinical correlation is necessary.  Sagittal images of the spine shows significant disc space flattening at L5-S1 level. There is mild posterior spurring with mild disc bulge at L4-L5 level.  Significant fatty infiltration of the liver. No focal hepatic mass. The gallbladder is contracted. No calcified gallstones are noted within gallbladder. The pancreas, spleen and adrenal glands are unremarkable. Kidneys are symmetrical in size and enhancement. There is a partially solid lesion with mixed cysts or necrotic component in lower pole of the left kidney measures 2.5 x 2.2 cm. Correlation with urology exam and  further evaluation with MRI is recommended.  No aortic aneurysm. No small bowel obstruction. Normal appendix. No pericecal inflammation. Liquid stool noted in left colon and rectosigmoid colon suspicious for diarrhea. Prostate gland and seminal vesicles are unremarkable. The urinary bladder is unremarkable.  IMPRESSION: 1. Significant fatty infiltration of the liver. There is patchy infiltrate with central distribution in right lung base. Patchy peripheral infiltrate bilateral lower lobe suspicious for pneumonia or pulmonary edema. Clinical correlation is necessary. 2. There is partially solid lesion in lower pole of the left kidney measures 2.5 x 2.2 cm. This is highly suspicious for renal cell carcinoma. Correlation with urology exam and further evolution with MRI is recommended. 3. Normal appendix.  No pericecal inflammation. 4. No hydronephrosis or hydroureter. 5. Liquid stool noted in distal colon suspicious for diarrhea. These results were called by telephone at the time of interpretation on 05/15/2014 at 8:39 pm to Dr. Lucien Mons , who verbally acknowledged these results.   Electronically Signed   By: Lahoma Crocker M.D.   On: 05/15/2014 20:40    Scheduled Meds: . dextromethorphan-guaiFENesin  1 tablet Oral BID  . heparin  5,000 Units Subcutaneous 3 times per day  . ipratropium-albuterol  3 mL Nebulization Q6H  . levofloxacin (LEVAQUIN) IV  750 mg Intravenous Q24H  . methylPREDNISolone (SOLU-MEDROL) injection  60 mg Intravenous Daily   Continuous Infusions: . sodium chloride 100 mL/hr at 05/17/14 0711    Principal Problem:   CAP (community acquired pneumonia) Active Problems:   Hypertension   Nausea vomiting and diarrhea   Mass of left kidney   Transaminitis   Acute renal insufficiency   Acute renal failure syndrome   Dehydration   Left kidney mass    Time spent: 40 min   WOODS, CURTIS, J  [facility] Pager [phone]. If 7PM-7AM, please contact night-coverage at  [email or web address], password [facility] 05/17/2014, 8:45 AM  LOS: 1 day    Care during the described time interval was provided by me .  I have reviewed this patient's available data, including medical history, events of note, physical examination, radiology studies and test results as part of my evaluation  Dia Crawford, MD [phone] Pager

## 2014-05-18 LAB — CBC WITH DIFFERENTIAL/PLATELET
Basophils Absolute: 0.1 10*3/uL (ref 0.0–0.1)
Basophils Relative: 1 % (ref 0–1)
EOS PCT: 0 % (ref 0–5)
Eosinophils Absolute: 0 10*3/uL (ref 0.0–0.7)
HCT: 44.5 % (ref 39.0–52.0)
Hemoglobin: 15.4 g/dL (ref 13.0–17.0)
LYMPHS PCT: 31 % (ref 12–46)
Lymphs Abs: 2.1 10*3/uL (ref 0.7–4.0)
MCH: 31.2 pg (ref 26.0–34.0)
MCHC: 34.6 g/dL (ref 30.0–36.0)
MCV: 90.1 fL (ref 78.0–100.0)
Monocytes Absolute: 0.8 10*3/uL (ref 0.1–1.0)
Monocytes Relative: 12 % (ref 3–12)
Neutro Abs: 3.7 10*3/uL (ref 1.7–7.7)
Neutrophils Relative %: 56 % (ref 43–77)
Platelets: 142 10*3/uL — ABNORMAL LOW (ref 150–400)
RBC: 4.94 MIL/uL (ref 4.22–5.81)
RDW: 13.8 % (ref 11.5–15.5)
WBC: 6.7 10*3/uL (ref 4.0–10.5)

## 2014-05-18 LAB — COMPREHENSIVE METABOLIC PANEL
ALT: 95 U/L — AB (ref 0–53)
ANION GAP: 3 — AB (ref 5–15)
AST: 138 U/L — ABNORMAL HIGH (ref 0–37)
Albumin: 2.6 g/dL — ABNORMAL LOW (ref 3.5–5.2)
Alkaline Phosphatase: 70 U/L (ref 39–117)
BUN: 15 mg/dL (ref 6–23)
CO2: 28 mmol/L (ref 19–32)
CREATININE: 1.07 mg/dL (ref 0.50–1.35)
Calcium: 8.5 mg/dL (ref 8.4–10.5)
Chloride: 109 mmol/L (ref 96–112)
GFR calc Af Amer: >90 mL/min (ref >90)
GFR calc non Af Amer: 84 mL/min — ABNORMAL LOW (ref >90)
GLUCOSE: 115 mg/dL — AB (ref 70–99)
POTASSIUM: 3.4 mmol/L — AB (ref 3.5–5.1)
Sodium: 140 mmol/L (ref 135–145)
TOTAL PROTEIN: 5.7 g/dL — AB (ref 6.0–8.3)
Total Bilirubin: 1.2 mg/dL (ref 0.3–1.2)

## 2014-05-18 MED ORDER — DM-GUAIFENESIN ER 30-600 MG PO TB12: 1.0000 | ORAL_TABLET | Freq: Two times a day (BID) | ORAL | Status: DC

## 2014-05-18 MED ORDER — LEVOFLOXACIN 500 MG PO TABS: 500.0000 mg | ORAL_TABLET | Freq: Every day | ORAL | Status: AC

## 2014-05-18 MED ORDER — ATENOLOL 25 MG PO TABS: 25.0000 mg | ORAL_TABLET | Freq: Every day | ORAL | Status: DC

## 2014-05-18 MED ORDER — ALBUTEROL SULFATE HFA 108 (90 BASE) MCG/ACT IN AERS: 2.0000 | INHALATION_SPRAY | Freq: Four times a day (QID) | RESPIRATORY_TRACT | Status: DC | PRN

## 2014-05-18 MED ORDER — POTASSIUM CHLORIDE CRYS ER 20 MEQ PO TBCR
40.0000 meq | EXTENDED_RELEASE_TABLET | Freq: Once | ORAL | Status: AC
  Administered 2014-05-18: 40 meq via ORAL
  Filled 2014-05-18: qty 2

## 2014-05-18 NOTE — Progress Notes (Signed)
Patient discharge teaching given, including activity, diet, follow-up appoints, and medications. Patient verbalized understanding of all discharge instructions. IV access was d/c'd. Vitals are stable. Skin is intact except as charted in most recent assessments. Pt to be escorted out by RN, to be driven home taxi.  Jillyn Ledger, MBA, BS, RN

## 2014-05-18 NOTE — Discharge Summary (Signed)
Physician Discharge Summary  [name] MRN: [id number] DOB/AGE: 1970-03-31 44 y.o.  PCP: Delman Cheadle, MD   Admit date: 05/15/2014 Discharge date: 05/18/2014  Discharge Diagnoses:      CAP (community acquired pneumonia) Active Problems:   Hypertension   Nausea vomiting and diarrhea   Mass of left kidney   Transaminitis   Acute renal insufficiency   Acute renal failure syndrome   Dehydration   Left kidney mass  Follow-up with urology outpatient at the first available appointment Follow-up with PCP in 5-7 days Repeat CBC, CMP in one week     Medication List    STOP taking these medications        lisinopril-hydrochlorothiazide 20-25 MG per tablet  Commonly known as:  PRINZIDE,ZESTORETIC      TAKE these medications        albuterol 108 (90 BASE) MCG/ACT inhaler  Commonly known as:  PROVENTIL HFA;VENTOLIN HFA  Inhale 2 puffs into the lungs every 6 (six) hours as needed for wheezing or shortness of breath.     atenolol 25 MG tablet  Commonly known as:  TENORMIN  Take 1 tablet (25 mg total) by mouth daily.     dextromethorphan-guaiFENesin 30-600 MG per 12 hr tablet  Commonly known as:  MUCINEX DM  Take 1 tablet by mouth 2 (two) times daily.     hydrOXYzine 25 MG tablet  Commonly known as:  ATARAX/VISTARIL  TAKE 1/2 TO 1  TABLETS (12.5 MG TO 25 MG TOTAL) BY MOUTH EVERY 8  HOURS AS NEEDED FOR ANXIETY.     levofloxacin 500 MG tablet  Commonly known as:  LEVAQUIN  Take 1 tablet (500 mg total) by mouth daily.     lithium carbonate 150 MG capsule  Take 150 mg by mouth 3 (three) times daily with meals.     meclizine 25 MG tablet  Commonly known as:  ANTIVERT  Take one half to one about every 6 hours as needed for dizziness. It may cause drowsiness     ranitidine 150 MG tablet  Commonly known as:  ZANTAC  Take 1 tablet (150 mg total) by mouth 2 (two) times daily. For heartburn and reflux.     traZODone 150 MG tablet  Commonly known as:  DESYREL  Take 150  mg by mouth at bedtime.        Discharge Condition:    Disposition: Home   Consults:  None   Significant Diagnostic Studies: Dg Chest 2 View  05/15/2014   CLINICAL DATA:  Nausea, diarrhea and weakness for 3 days, history hypertension  EXAM: CHEST  2 VIEW  COMPARISON:  08/23/2012  FINDINGS: Enlargement of cardiac silhouette with pulmonary vascular congestion.  Mediastinal contours normal.  Bibasilar atelectasis.  Perihilar interstitial prominence increased since previous exam, cannot exclude minimal failure/edema.  No gross pleural effusion, segmental consolidation or pneumothorax.  Osseous structures unremarkable.  IMPRESSION: Enlargement of cardiac silhouette with pulmonary vascular congestion and questionable mild perihilar edema.  Bibasilar atelectasis.   Electronically Signed   By: Lavonia Dana M.D.   On: 05/15/2014 18:34   Mr Abdomen W Wo Contrast  05/17/2014   CLINICAL DATA:  Evaluate kidney lesion  EXAM: MRI ABDOMEN WITHOUT AND WITH CONTRAST  TECHNIQUE: Multiplanar multisequence MR imaging of the abdomen was performed both before and after the administration of intravenous contrast.  CONTRAST:  35m MULTIHANCE GADOBENATE DIMEGLUMINE 529 MG/ML IV SOLN  COMPARISON:  05/15/2014  FINDINGS: Lower chest: No pleural effusion identified. Atelectasis noted in the  lung bases.  Hepatobiliary: Diffuse hepatic steatosis identified. No suspicious liver lesions identified. The gallbladder appears normal. No biliary obstruction.  Pancreas: Normal appearance of the pancreas.  Spleen: Negative.  Adrenals/Urinary Tract: Normal adrenal glands. Normal appearance of the right kidney. Round, partially exophytic cystic and solid mass arising from the lower pole of the left kidney measures 2.2 cm. Internal solid components demonstrate enhancement following IV contrast administration. The left renal vein and IVC are patent.  Stomach/Bowel: The stomach and visualized upper abdominal bowel loops are unremarkable.   Vascular/Lymphatic: Normal appearance of the abdominal aorta. No enlarged upper abdominal lymph nodes identified.  Other: No free fluid or fluid collections identified within the upper abdomen.  Musculoskeletal: Normal signal identified from within the bone marrow.  IMPRESSION: 1. Cystic and solid enhancing lesion arising from the inferior pole of the left kidney is worrisome for renal cell carcinoma. 2. No specific features identified to suggest metastatic disease. 3. Hepatic steatosis.   Electronically Signed   By: Kerby Moors M.D.   On: 05/17/2014 10:17   Ct Abdomen Pelvis W Contrast  05/15/2014   CLINICAL DATA:  Nausea, diarrhea, weakness  EXAM: CT ABDOMEN AND PELVIS WITH CONTRAST  TECHNIQUE: Multidetector CT imaging of the abdomen and pelvis was performed using the standard protocol following bolus administration of intravenous contrast.  CONTRAST:  73m OMNIPAQUE IOHEXOL 300 MG/ML SOLN, 231mOMNIPAQUE IOHEXOL 300 MG/ML SOLN  COMPARISON:  None.  FINDINGS: Lung bases shows patchy infiltrate in right base posteriorly. There is patchy infiltrate with peripheral distribution bilateral lower lobe. Finding may be due to pneumonia or pulmonary edema. Clinical correlation is necessary.  Sagittal images of the spine shows significant disc space flattening at L5-S1 level. There is mild posterior spurring with mild disc bulge at L4-L5 level.  Significant fatty infiltration of the liver. No focal hepatic mass. The gallbladder is contracted. No calcified gallstones are noted within gallbladder. The pancreas, spleen and adrenal glands are unremarkable. Kidneys are symmetrical in size and enhancement. There is a partially solid lesion with mixed cysts or necrotic component in lower pole of the left kidney measures 2.5 x 2.2 cm. Correlation with urology exam and further evaluation with MRI is recommended.  No aortic aneurysm. No small bowel obstruction. Normal appendix. No pericecal inflammation. Liquid stool noted in  left colon and rectosigmoid colon suspicious for diarrhea. Prostate gland and seminal vesicles are unremarkable. The urinary bladder is unremarkable.  IMPRESSION: 1. Significant fatty infiltration of the liver. There is patchy infiltrate with central distribution in right lung base. Patchy peripheral infiltrate bilateral lower lobe suspicious for pneumonia or pulmonary edema. Clinical correlation is necessary. 2. There is partially solid lesion in lower pole of the left kidney measures 2.5 x 2.2 cm. This is highly suspicious for renal cell carcinoma. Correlation with urology exam and further evolution with MRI is recommended. 3. Normal appendix.  No pericecal inflammation. 4. No hydronephrosis or hydroureter. 5. Liquid stool noted in distal colon suspicious for diarrhea. These results were called by telephone at the time of interpretation on 05/15/2014 at 8:39 pm to Dr. ROLucien Mons who verbally acknowledged these results.   Electronically Signed   By: LiLahoma Crocker.D.   On: 05/15/2014 20:40      Microbiology: Recent Results (from the past 240 hour(s))  Urine culture     Status: None   Collection Time: 05/15/14  7:10 PM  Result Value Ref Range Status   Specimen Description URINE, CLEAN CATCH  Final  Special Requests NONE  Final   Colony Count   Final    7,000 COLONIES/ML Performed at [facility]    Culture   Final    INSIGNIFICANT GROWTH Performed at [facility]    Report Status 05/17/2014 FINAL  Final  Blood culture (routine x 2)     Status: None (Preliminary result)   Collection Time: 05/15/14  9:55 PM  Result Value Ref Range Status   Specimen Description BLOOD RIGHT ANTECUBITAL  Final   Special Requests   Final    BOTTLES DRAWN AEROBIC AND ANAEROBIC AER 5.5cc ANA 5.5cc   Culture   Final           BLOOD CULTURE RECEIVED NO GROWTH TO DATE CULTURE WILL BE HELD FOR 5 DAYS BEFORE ISSUING A FINAL NEGATIVE REPORT Performed at [facility]    Report Status PENDING   Incomplete  Blood culture (routine x 2)     Status: None (Preliminary result)   Collection Time: 05/15/14 10:00 PM  Result Value Ref Range Status   Specimen Description BLOOD LEFT ANTECUBITAL  Final   Special Requests   Final    BOTTLES DRAWN AEROBIC AND ANAEROBIC AER 5cc ANA 5cc   Culture   Final           BLOOD CULTURE RECEIVED NO GROWTH TO DATE CULTURE WILL BE HELD FOR 5 DAYS BEFORE ISSUING A FINAL NEGATIVE REPORT Performed at [facility]    Report Status PENDING  Incomplete  Culture, expectorated sputum-assessment     Status: None   Collection Time: 05/16/14 12:14 AM  Result Value Ref Range Status   Specimen Description SPUTUM  Final   Special Requests NONE  Final   Sputum evaluation   Final    MICROSCOPIC FINDINGS SUGGEST THAT THIS SPECIMEN IS NOT REPRESENTATIVE OF LOWER RESPIRATORY SECRETIONS. PLEASE RECOLLECT. CALLED TO Pecola Lawless, [place] 05/16/14 M.CAMPBELL    Report Status 05/16/2014 FINAL  Final     Labs: Results for orders placed or performed during the hospital encounter of 05/15/14 (from the past 48 hour(s))  Comprehensive metabolic panel     Status: Abnormal   Collection Time: 05/17/14  5:30 AM  Result Value Ref Range   Sodium 138 135 - 145 mmol/L   Potassium 3.5 3.5 - 5.1 mmol/L   Chloride 106 96 - 112 mmol/L   CO2 23 19 - 32 mmol/L   Glucose, Bld 140 (H) 70 - 99 mg/dL   BUN 14 6 - 23 mg/dL   Creatinine, Ser 1.10 0.50 - 1.35 mg/dL   Calcium 8.6 8.4 - 10.5 mg/dL   Total Protein 6.1 6.0 - 8.3 g/dL   Albumin 2.8 (L) 3.5 - 5.2 g/dL   AST 139 (H) 0 - 37 U/L   ALT 61 (H) 0 - 53 U/L   Alkaline Phosphatase 81 39 - 117 U/L   Total Bilirubin 1.1 0.3 - 1.2 mg/dL   GFR calc non Af Amer 81 (L) >90 mL/min   GFR calc Af Amer >90 >90 mL/min    Comment: (NOTE) The eGFR has been calculated using the CKD EPI equation. This calculation has not been validated in all clinical situations. eGFR's persistently <90 mL/min signify possible Chronic Kidney Disease.     Anion gap 9 5 - 15  CBC with Differential/Platelet     Status: Abnormal   Collection Time: 05/17/14  5:30 AM  Result Value Ref Range   WBC 2.5 (L) 4.0 - 10.5 K/uL  RBC 5.42 4.22 - 5.81 MIL/uL   Hemoglobin 17.0 13.0 - 17.0 g/dL   HCT 48.5 39.0 - 52.0 %   MCV 89.5 78.0 - 100.0 fL   MCH 31.4 26.0 - 34.0 pg   MCHC 35.1 30.0 - 36.0 g/dL   RDW 13.7 11.5 - 15.5 %   Platelets 135 (L) 150 - 400 K/uL   Neutrophils Relative % 60 43 - 77 %   Lymphocytes Relative 33 12 - 46 %   Monocytes Relative 7 3 - 12 %   Eosinophils Relative 0 0 - 5 %   Basophils Relative 0 0 - 1 %   Band Neutrophils 0 0 - 10 %   Metamyelocytes Relative 0 %   Myelocytes 0 %   Promyelocytes Absolute 0 %   Blasts 0 %   nRBC 0 0 /100 WBC   Neutro Abs 1.5 (L) 1.7 - 7.7 K/uL   Lymphs Abs 0.8 0.7 - 4.0 K/uL   Monocytes Absolute 0.2 0.1 - 1.0 K/uL   Eosinophils Absolute 0.0 0.0 - 0.7 K/uL   Basophils Absolute 0.0 0.0 - 0.1 K/uL   WBC Morphology ATYPICAL LYMPHOCYTES    Smear Review PLATELET CLUMPS NOTED ON SMEAR   Comprehensive metabolic panel     Status: Abnormal   Collection Time: 05/18/14  6:27 AM  Result Value Ref Range   Sodium 140 135 - 145 mmol/L   Potassium 3.4 (L) 3.5 - 5.1 mmol/L   Chloride 109 96 - 112 mmol/L   CO2 28 19 - 32 mmol/L   Glucose, Bld 115 (H) 70 - 99 mg/dL   BUN 15 6 - 23 mg/dL   Creatinine, Ser 1.07 0.50 - 1.35 mg/dL   Calcium 8.5 8.4 - 10.5 mg/dL   Total Protein 5.7 (L) 6.0 - 8.3 g/dL   Albumin 2.6 (L) 3.5 - 5.2 g/dL   AST 138 (H) 0 - 37 U/L   ALT 95 (H) 0 - 53 U/L   Alkaline Phosphatase 70 39 - 117 U/L   Total Bilirubin 1.2 0.3 - 1.2 mg/dL   GFR calc non Af Amer 84 (L) >90 mL/min   GFR calc Af Amer >90 >90 mL/min    Comment: (NOTE) The eGFR has been calculated using the CKD EPI equation. This calculation has not been validated in all clinical situations. eGFR's persistently <90 mL/min signify possible Chronic Kidney Disease.    Anion gap 3 (L) 5 - 15  CBC with Differential/Platelet      Status: Abnormal   Collection Time: 05/18/14  6:27 AM  Result Value Ref Range   WBC 6.7 4.0 - 10.5 K/uL   RBC 4.94 4.22 - 5.81 MIL/uL   Hemoglobin 15.4 13.0 - 17.0 g/dL   HCT 44.5 39.0 - 52.0 %   MCV 90.1 78.0 - 100.0 fL   MCH 31.2 26.0 - 34.0 pg   MCHC 34.6 30.0 - 36.0 g/dL   RDW 13.8 11.5 - 15.5 %   Platelets 142 (L) 150 - 400 K/uL   Neutrophils Relative % 56 43 - 77 %   Lymphocytes Relative 31 12 - 46 %   Monocytes Relative 12 3 - 12 %   Eosinophils Relative 0 0 - 5 %   Basophils Relative 1 0 - 1 %   Neutro Abs 3.7 1.7 - 7.7 K/uL   Lymphs Abs 2.1 0.7 - 4.0 K/uL   Monocytes Absolute 0.8 0.1 - 1.0 K/uL   Eosinophils Absolute 0.0 0.0 - 0.7 K/uL  Basophils Absolute 0.1 0.0 - 0.1 K/uL   WBC Morphology ATYPICAL LYMPHOCYTES     Comment: PLASMACYTOID LYMPHS     HPI :[name] is a 44 y.o. male who presents to the ED with 3 day history of N/D, fatigue, cough. No abdominal pain. Theraflu did not help symtpoms. Cough is productive with phlegm. Mouth feels dry.  HOSPITAL COURSE: 1. CAP -CT scan of the chest shows bilateral infiltrates 1. Levaquin for empiric treatment, however suspect that he most likely has a viral syndrome given the transaminitis, N/V/D, continue Levaquin for another 5 days 2. BCX and sputum CX no growth so far 2. N/V/D - 1. Diet advanced 2. zofran for nausea 3. Transaminitis 1. AST ALT trending down, no history of alcohol use, patient does have fatty infiltration of the liver 2. Likely a result of the viral syndrome causing his symptoms 4. Acute renal insufficiency - creatinine up to 1.5 from 1.0. Likely dehydration as evidenced by his hemoconcentration with HGB of 19. 1. Creatinine improved from 1.54-1.07 prior to discharge with IV hydration 2. Continue to hold lisinopril-hctz 3. Recheck CMP outpatient 4. Resume lisinopril/HCTZ if creatinine is stable L kidney mass -discussed with dr Thad Ranger, urology, they're often state that they will  follow-up    Discharge Exam:  Blood pressure 137/96, pulse 84, temperature 98.6 F (37 C), temperature source Oral, resp. rate 18, height _0  (1.727 m), weight 113.898 kg (251 lb 1.6 oz), SpO2 92 %.  Pulmonary/Chest: Effort normal and breath sounds normal.  Abdominal: Soft. Bowel sounds are normal. He exhibits no distension. There is tenderness. There is no rigidity, no rebound and no guarding.  Generalized abdominal tenderness. No peritoneal signs.  Musculoskeletal: Normal range of motion. He exhibits no edema.  Neurological: He is alert and oriented to person, place, and time.  Skin: Skin is warm and dry. He is not diaphoretic.        Discharge Instructions    Diet - low sodium heart healthy    Complete by:  As directed      Increase activity slowly    Complete by:  As directed            Follow-up Information    Follow up with [facility] In 1 week.   Why:  renal cell ca, appt in one week   Contact information:   [address]  [place] [address] [place] [address] [phone]       Follow up with Delman Cheadle, MD. Schedule an appointment as soon as possible for a visit in 3 days.   Specialty:  Family Medicine   Contact information:   [address] [place] [address] [phone]       Signed: Reyne Dumas 05/18/2014, 12:15 PM

## 2014-05-18 NOTE — Progress Notes (Signed)
SATURATION QUALIFICATIONS: (This note is used to comply with regulatory documentation for home oxygen)  Patient Saturations on Room Air at Rest = 95%  Patient Saturations on Room Air while Ambulating = 93%    Please briefly explain why patient needs home oxygen: N/A 

## 2014-05-19 LAB — RESPIRATORY VIRUS PANEL
Adenovirus: NEGATIVE
INFLUENZA A: NEGATIVE
Influenza B: NEGATIVE
Metapneumovirus: NEGATIVE
PARAINFLUENZA 1 A: NEGATIVE
PARAINFLUENZA 2 A: NEGATIVE
PARAINFLUENZA 3 A: NEGATIVE
RESPIRATORY SYNCYTIAL VIRUS B: NEGATIVE
RHINOVIRUS: NEGATIVE
Respiratory Syncytial Virus A: NEGATIVE

## 2014-05-19 LAB — GI PATHOGEN PANEL BY PCR, STOOL
C difficile toxin A/B: NOT DETECTED
CRYPTOSPORIDIUM BY PCR: NOT DETECTED
Campylobacter by PCR: NOT DETECTED
E coli (ETEC) LT/ST: NOT DETECTED
E coli (STEC): NOT DETECTED
E coli 0157 by PCR: NOT DETECTED
G LAMBLIA BY PCR: NOT DETECTED
NOROVIRUS G1/G2: NOT DETECTED
ROTAVIRUS A BY PCR: NOT DETECTED
SHIGELLA BY PCR: NOT DETECTED
Salmonella by PCR: NOT DETECTED

## 2014-05-22 LAB — CULTURE, BLOOD (ROUTINE X 2)
Culture: NO GROWTH
Culture: NO GROWTH

## 2014-07-03 ENCOUNTER — Other Ambulatory Visit: Payer: Self-pay | Admitting: Urology

## 2014-07-21 ENCOUNTER — Telehealth: Payer: Self-pay

## 2014-07-21 NOTE — Telephone Encounter (Signed)
FYI Dr Brigitte Pulse.

## 2014-07-21 NOTE — Telephone Encounter (Signed)
Noted, i have not seen pt in 2 yrs so i'm glad someone is taking good care of him and i'm sorry to hear that he has cancer but i'm sure he is in good hands. Welcome to return if needed in the future.

## 2014-07-21 NOTE — Telephone Encounter (Signed)
Betsy from [facility] wanted Dr.Shaw to know that they are following pt now, he is having surgery for some type of cancer. You may reach Gwinda Passe if any questions at [phone]

## 2014-08-31 ENCOUNTER — Ambulatory Visit (HOSPITAL_COMMUNITY)
Admission: RE | Admit: 2014-08-31 | Discharge: 2014-08-31 | Disposition: A | Discharge status: Home / Self Care | Payer: BLUE CROSS/BLUE SHIELD | Source: Ambulatory Visit | Attending: Anesthesiology | Admitting: Anesthesiology

## 2014-08-31 ENCOUNTER — Encounter (HOSPITAL_COMMUNITY): Payer: Self-pay

## 2014-08-31 ENCOUNTER — Encounter (HOSPITAL_COMMUNITY)
Admission: RE | Admit: 2014-08-31 | Discharge: 2014-08-31 | Disposition: A | Discharge status: Home / Self Care | Payer: BLUE CROSS/BLUE SHIELD | Source: Ambulatory Visit | Attending: Urology | Admitting: Urology

## 2014-08-31 DIAGNOSIS — Z01812 Encounter for preprocedural laboratory examination: Secondary | ICD-10-CM

## 2014-08-31 DIAGNOSIS — Z01810 Encounter for preprocedural cardiovascular examination: Secondary | ICD-10-CM

## 2014-08-31 DIAGNOSIS — Z01818 Encounter for other preprocedural examination: Secondary | ICD-10-CM | POA: Insufficient documentation

## 2014-08-31 DIAGNOSIS — I10 Essential (primary) hypertension: Secondary | ICD-10-CM | POA: Insufficient documentation

## 2014-08-31 DIAGNOSIS — R9389 Abnormal findings on diagnostic imaging of other specified body structures: Secondary | ICD-10-CM

## 2014-08-31 HISTORY — DX: Bipolar disorder, unspecified: F31.9

## 2014-08-31 HISTORY — DX: Personal history of urinary calculi: Z87.442

## 2014-08-31 HISTORY — DX: Pneumonia, unspecified organism: J18.9

## 2014-08-31 HISTORY — DX: Headache: R51

## 2014-08-31 HISTORY — DX: Gastro-esophageal reflux disease without esophagitis: K21.9

## 2014-08-31 HISTORY — DX: Headache, unspecified: R51.9

## 2014-08-31 HISTORY — DX: Malignant (primary) neoplasm, unspecified: C80.1

## 2014-08-31 HISTORY — DX: Personal history of transient ischemic attack (TIA), and cerebral infarction without residual deficits: Z86.73

## 2014-08-31 LAB — BASIC METABOLIC PANEL
Anion gap: 8 (ref 5–15)
BUN: 17 mg/dL (ref 6–20)
CALCIUM: 9.1 mg/dL (ref 8.9–10.3)
CO2: 24 mmol/L (ref 22–32)
Chloride: 106 mmol/L (ref 101–111)
Creatinine, Ser: 0.83 mg/dL (ref 0.61–1.24)
GFR calc Af Amer: >60 mL/min (ref >60)
Glucose, Bld: 92 mg/dL (ref 65–99)
Potassium: 4 mmol/L (ref 3.5–5.1)
SODIUM: 138 mmol/L (ref 135–145)

## 2014-08-31 LAB — CBC
HCT: 47.3 % (ref 39.0–52.0)
Hemoglobin: 16.7 g/dL (ref 13.0–17.0)
MCH: 31.7 pg (ref 26.0–34.0)
MCHC: 35.3 g/dL (ref 30.0–36.0)
MCV: 89.8 fL (ref 78.0–100.0)
Platelets: 275 10*3/uL (ref 150–400)
RBC: 5.27 MIL/uL (ref 4.22–5.81)
RDW: 13.6 % (ref 11.5–15.5)
WBC: 12.3 10*3/uL — ABNORMAL HIGH (ref 4.0–10.5)

## 2014-08-31 LAB — ABO/RH: ABO/RH(D): B POS

## 2014-08-31 NOTE — Progress Notes (Signed)
EKG shown to Dr. Marcell Barlow with anesthesia. Okay to proceed with surgery.

## 2014-08-31 NOTE — Patient Instructions (Addendum)
[name]  08/31/2014   Your procedure is scheduled on: Wednesday 09/02/14  Report to [facility] Main  Entrance and follow signs to               [place] at 10:30 AM.  Call this number if you have problems the morning of surgery [phone]   Remember:  ONLY 1 PERSON MAY GO WITH YOU TO SHORT STAY TO GET  READY MORNING OF [place].  Do not eat food or drink liquids :After Midnight.   Take these medicines the morning of surgery with A SIP OF WATER: atenolol, hydroxyzine if needed, eye drops if needed                               You may not have any metal on your body including hair pins and              piercings  Do not wear jewelry, make-up, lotions, powders or perfumes, deodorant             Do not wear nail polish.  Do not shave  48 hours prior to surgery.              Men may shave face and neck.  Do not bring valuables to the hospital. [place].  Contacts, dentures or bridgework may not be worn into surgery.  Leave suitcase in the car. After surgery it may be brought to your room.  _____________________________________________________________________           [facility] - Preparing for Surgery Before surgery, you can play an important role.  Because skin is not sterile, your skin needs to be as free of germs as possible.  You can reduce the number of germs on your skin by washing with CHG (chlorahexidine gluconate) soap before surgery.  CHG is an antiseptic cleaner which kills germs and bonds with the skin to continue killing germs even after washing. Please DO NOT use if you have an allergy to CHG or antibacterial soaps.  If your skin becomes reddened/irritated stop using the CHG and inform your nurse when you arrive at Short Stay. Do not shave (including legs and underarms) for at least 48 hours prior to the first CHG shower.  You may shave your face/neck. Please follow these instructions  carefully:  1.  Shower with CHG Soap the night before surgery and the  morning of Surgery.  2.  If you choose to wash your hair, wash your hair first as usual with your  normal  shampoo.  3.  After you shampoo, rinse your hair and body thoroughly to remove the  shampoo.                            4.  Use CHG as you would any other liquid soap.  You can apply chg directly  to the skin and wash                       Gently with a scrungie or clean washcloth.  5.  Apply the CHG Soap to your body ONLY FROM THE NECK DOWN.   Do not use on face/ open  Wound or open sores. Avoid contact with eyes, ears mouth and genitals (private parts).                       Wash face,  Genitals (private parts) with your normal soap.             6.  Wash thoroughly, paying special attention to the area where your surgery  will be performed.  7.  Thoroughly rinse your body with warm water from the neck down.  8.  DO NOT shower/wash with your normal soap after using and rinsing off  the CHG Soap.                9.  Pat yourself dry with a clean towel.            10.  Wear clean pajamas.            11.  Place clean sheets on your bed the night of your first shower and do not  sleep with pets. Day of Surgery : Do not apply any lotions/deodorants the morning of surgery.  Please wear clean clothes to the hospital/surgery center.  FAILURE TO FOLLOW THESE INSTRUCTIONS MAY RESULT IN THE CANCELLATION OF YOUR SURGERY PATIENT SIGNATURE_________________________________  NURSE SIGNATURE__________________________________  ________________________________________________________________________

## 2014-08-31 NOTE — Progress Notes (Signed)
   08/31/14 1311  OBSTRUCTIVE SLEEP APNEA  Have you ever been diagnosed with sleep apnea through a sleep study? No  Do you snore loudly (loud enough to be heard through closed doors)?  1  Do you often feel tired, fatigued, or sleepy during the daytime? 1  Has anyone observed you stop breathing during your sleep? 1  Do you have, or are you being treated for high blood pressure? 1  BMI more than 35 kg/m2? 1  Age over 43 years old? 0  Neck circumference greater than 40 cm/16 inches? 1  Gender: 1

## 2014-09-02 ENCOUNTER — Inpatient Hospital Stay (HOSPITAL_COMMUNITY): Payer: BLUE CROSS/BLUE SHIELD | Admitting: Anesthesiology

## 2014-09-02 ENCOUNTER — Encounter (HOSPITAL_COMMUNITY): Payer: Self-pay

## 2014-09-02 ENCOUNTER — Inpatient Hospital Stay (HOSPITAL_COMMUNITY)
Admission: RE | Admit: 2014-09-02 | Discharge: 2014-09-03 | DRG: 658 | Disposition: A | Discharge status: Home / Self Care | Payer: BLUE CROSS/BLUE SHIELD | Source: Ambulatory Visit | Attending: Urology | Admitting: Urology

## 2014-09-02 ENCOUNTER — Encounter (HOSPITAL_COMMUNITY)
Admission: RE | Disposition: A | Discharge status: Home / Self Care | Payer: Self-pay | Source: Ambulatory Visit | Attending: Urology

## 2014-09-02 DIAGNOSIS — E669 Obesity, unspecified: Secondary | ICD-10-CM | POA: Diagnosis present

## 2014-09-02 DIAGNOSIS — Z01812 Encounter for preprocedural laboratory examination: Secondary | ICD-10-CM

## 2014-09-02 DIAGNOSIS — N2889 Other specified disorders of kidney and ureter: Secondary | ICD-10-CM | POA: Diagnosis present

## 2014-09-02 DIAGNOSIS — F319 Bipolar disorder, unspecified: Secondary | ICD-10-CM | POA: Diagnosis present

## 2014-09-02 DIAGNOSIS — Z6838 Body mass index (BMI) 38.0-38.9, adult: Secondary | ICD-10-CM

## 2014-09-02 DIAGNOSIS — Z87442 Personal history of urinary calculi: Secondary | ICD-10-CM

## 2014-09-02 DIAGNOSIS — C642 Malignant neoplasm of left kidney, except renal pelvis: Principal | ICD-10-CM | POA: Diagnosis present

## 2014-09-02 DIAGNOSIS — K219 Gastro-esophageal reflux disease without esophagitis: Secondary | ICD-10-CM | POA: Diagnosis present

## 2014-09-02 DIAGNOSIS — Z833 Family history of diabetes mellitus: Secondary | ICD-10-CM

## 2014-09-02 DIAGNOSIS — Z8673 Personal history of transient ischemic attack (TIA), and cerebral infarction without residual deficits: Secondary | ICD-10-CM | POA: Diagnosis not present

## 2014-09-02 DIAGNOSIS — I10 Essential (primary) hypertension: Secondary | ICD-10-CM | POA: Diagnosis present

## 2014-09-02 DIAGNOSIS — Z8249 Family history of ischemic heart disease and other diseases of the circulatory system: Secondary | ICD-10-CM | POA: Diagnosis not present

## 2014-09-02 HISTORY — PX: ROBOTIC ASSITED PARTIAL NEPHRECTOMY: SHX6087

## 2014-09-02 LAB — HEMOGLOBIN AND HEMATOCRIT, BLOOD
HCT: 47.1 % (ref 39.0–52.0)
HEMOGLOBIN: 16.4 g/dL (ref 13.0–17.0)

## 2014-09-02 LAB — TYPE AND SCREEN
ABO/RH(D): B POS
ANTIBODY SCREEN: NEGATIVE

## 2014-09-02 SURGERY — ROBOTIC ASSITED PARTIAL NEPHRECTOMY
Anesthesia: General | Site: Flank | Laterality: Left

## 2014-09-02 MED ORDER — DEXAMETHASONE SODIUM PHOSPHATE 10 MG/ML IJ SOLN
INTRAMUSCULAR | Status: AC
  Filled 2014-09-02: qty 1

## 2014-09-02 MED ORDER — ATENOLOL 25 MG PO TABS
25.0000 mg | ORAL_TABLET | Freq: Once | ORAL | Status: AC
  Administered 2014-09-02: 25 mg via ORAL
  Filled 2014-09-02: qty 1

## 2014-09-02 MED ORDER — NEOSTIGMINE METHYLSULFATE 10 MG/10ML IV SOLN
INTRAVENOUS | Status: AC
  Filled 2014-09-02: qty 1

## 2014-09-02 MED ORDER — ONDANSETRON HCL 4 MG/2ML IJ SOLN
INTRAMUSCULAR | Status: DC | PRN
  Administered 2014-09-02: 4 mg via INTRAVENOUS

## 2014-09-02 MED ORDER — MIDAZOLAM HCL 5 MG/5ML IJ SOLN
INTRAMUSCULAR | Status: DC | PRN
  Administered 2014-09-02: 2 mg via INTRAVENOUS

## 2014-09-02 MED ORDER — LIDOCAINE HCL (CARDIAC) 20 MG/ML IV SOLN
INTRAVENOUS | Status: DC | PRN
  Administered 2014-09-02: 100 mg via INTRAVENOUS

## 2014-09-02 MED ORDER — PROPOFOL 10 MG/ML IV BOLUS
INTRAVENOUS | Status: DC | PRN
  Administered 2014-09-02: 200 mg via INTRAVENOUS

## 2014-09-02 MED ORDER — BUPIVACAINE LIPOSOME 1.3 % IJ SUSP
20.0000 mL | Freq: Once | INTRAMUSCULAR | Status: AC
  Administered 2014-09-02: 20 mL
  Filled 2014-09-02: qty 20

## 2014-09-02 MED ORDER — GLYCOPYRROLATE 0.2 MG/ML IJ SOLN
INTRAMUSCULAR | Status: AC
  Filled 2014-09-02: qty 4

## 2014-09-02 MED ORDER — SUCCINYLCHOLINE CHLORIDE 20 MG/ML IJ SOLN
INTRAMUSCULAR | Status: DC | PRN
  Administered 2014-09-02: 100 mg via INTRAVENOUS

## 2014-09-02 MED ORDER — LACTATED RINGERS IR SOLN
Status: DC | PRN
  Administered 2014-09-02: 1000 mL

## 2014-09-02 MED ORDER — GLYCOPYRROLATE 0.2 MG/ML IJ SOLN
INTRAMUSCULAR | Status: DC | PRN
  Administered 2014-09-02: .8 mg via INTRAVENOUS

## 2014-09-02 MED ORDER — OXYCODONE HCL 5 MG PO TABS
5.0000 mg | ORAL_TABLET | ORAL | Status: DC | PRN
  Administered 2014-09-03 (×2): 5 mg via ORAL
  Filled 2014-09-02 (×2): qty 1

## 2014-09-02 MED ORDER — CEFAZOLIN SODIUM-DEXTROSE 2-3 GM-% IV SOLR
2.0000 g | INTRAVENOUS | Status: AC
  Administered 2014-09-02: 2 g via INTRAVENOUS

## 2014-09-02 MED ORDER — ONDANSETRON HCL 4 MG/2ML IJ SOLN
INTRAMUSCULAR | Status: AC
  Filled 2014-09-02: qty 2

## 2014-09-02 MED ORDER — HYDROMORPHONE HCL 1 MG/ML IJ SOLN
INTRAMUSCULAR | Status: DC
Start: 2014-09-02 — End: 2014-09-02
  Filled 2014-09-02: qty 1

## 2014-09-02 MED ORDER — SODIUM CHLORIDE 0.9 % IJ SOLN
INTRAMUSCULAR | Status: AC
  Filled 2014-09-02: qty 10

## 2014-09-02 MED ORDER — DEXTROSE-NACL 5-0.45 % IV SOLN
INTRAVENOUS | Status: DC
  Administered 2014-09-02 – 2014-09-03 (×2): via INTRAVENOUS

## 2014-09-02 MED ORDER — HYDROXYZINE HCL 25 MG PO TABS: 25.0000 mg | ORAL_TABLET | Freq: Three times a day (TID) | ORAL | Status: DC | PRN

## 2014-09-02 MED ORDER — LACTATED RINGERS IV SOLN
INTRAVENOUS | Status: DC
  Administered 2014-09-02: 1000 mL via INTRAVENOUS
  Administered 2014-09-02: 14:00:00 via INTRAVENOUS

## 2014-09-02 MED ORDER — DEXAMETHASONE SODIUM PHOSPHATE 10 MG/ML IJ SOLN
INTRAMUSCULAR | Status: DC | PRN
  Administered 2014-09-02: 10 mg via INTRAVENOUS
  Administered 2014-09-02: 6 mg via INTRAVENOUS

## 2014-09-02 MED ORDER — CISATRACURIUM BESYLATE (PF) 10 MG/5ML IV SOLN
INTRAVENOUS | Status: DC | PRN
  Administered 2014-09-02: 10 mg via INTRAVENOUS
  Administered 2014-09-02 (×2): 2 mg via INTRAVENOUS

## 2014-09-02 MED ORDER — HYDROMORPHONE HCL 1 MG/ML IJ SOLN
0.2500 mg | INTRAMUSCULAR | Status: DC | PRN
  Administered 2014-09-02: 0.25 mg via INTRAVENOUS
  Administered 2014-09-02: 0.5 mg via INTRAVENOUS
  Administered 2014-09-02: 0.25 mg via INTRAVENOUS

## 2014-09-02 MED ORDER — NEOSTIGMINE METHYLSULFATE 10 MG/10ML IV SOLN
INTRAVENOUS | Status: DC | PRN
  Administered 2014-09-02: 5 mg via INTRAVENOUS

## 2014-09-02 MED ORDER — PHENYLEPHRINE HCL 10 MG/ML IJ SOLN
INTRAMUSCULAR | Status: DC | PRN
  Administered 2014-09-02 (×2): 80 ug via INTRAVENOUS

## 2014-09-02 MED ORDER — SUFENTANIL CITRATE 50 MCG/ML IV SOLN
INTRAVENOUS | Status: AC
  Filled 2014-09-02: qty 1

## 2014-09-02 MED ORDER — PROPOFOL 10 MG/ML IV BOLUS
INTRAVENOUS | Status: AC
  Filled 2014-09-02: qty 20

## 2014-09-02 MED ORDER — MIDAZOLAM HCL 2 MG/2ML IJ SOLN
INTRAMUSCULAR | Status: AC
Start: 2014-09-02 — End: 2014-09-02
  Filled 2014-09-02: qty 2

## 2014-09-02 MED ORDER — CEFAZOLIN SODIUM-DEXTROSE 2-3 GM-% IV SOLR
INTRAVENOUS | Status: AC
  Filled 2014-09-02: qty 50

## 2014-09-02 MED ORDER — PHENYLEPHRINE HCL 10 MG/ML IJ SOLN
INTRAMUSCULAR | Status: AC
Start: 2014-09-02 — End: 2014-09-02
  Filled 2014-09-02: qty 2

## 2014-09-02 MED ORDER — ALBUTEROL SULFATE (2.5 MG/3ML) 0.083% IN NEBU: 3.0000 mL | INHALATION_SOLUTION | Freq: Four times a day (QID) | RESPIRATORY_TRACT | Status: DC | PRN

## 2014-09-02 MED ORDER — LACTATED RINGERS IV SOLN
INTRAVENOUS | Status: DC
  Administered 2014-09-02: 18:00:00 via INTRAVENOUS

## 2014-09-02 MED ORDER — ACETAMINOPHEN 500 MG PO TABS
1000.0000 mg | ORAL_TABLET | Freq: Four times a day (QID) | ORAL | Status: AC
  Administered 2014-09-02 – 2014-09-03 (×4): 1000 mg via ORAL
  Filled 2014-09-02 (×5): qty 2

## 2014-09-02 MED ORDER — SODIUM CHLORIDE 0.9 % IJ SOLN
INTRAMUSCULAR | Status: AC
  Filled 2014-09-02: qty 20

## 2014-09-02 MED ORDER — SUFENTANIL CITRATE 50 MCG/ML IV SOLN
INTRAVENOUS | Status: DC | PRN
  Administered 2014-09-02: 20 ug via INTRAVENOUS
  Administered 2014-09-02 (×3): 10 ug via INTRAVENOUS

## 2014-09-02 MED ORDER — MENTHOL 3 MG MT LOZG
1.0000 | LOZENGE | OROMUCOSAL | Status: DC | PRN
  Filled 2014-09-02: qty 9

## 2014-09-02 MED ORDER — PHENYLEPHRINE HCL 10 MG/ML IJ SOLN
20.0000 mg | INTRAMUSCULAR | Status: DC | PRN
  Administered 2014-09-02: 40 ug/min via INTRAVENOUS

## 2014-09-02 MED ORDER — PHENOL 1.4 % MT LIQD: 1.0000 | OROMUCOSAL | Status: DC | PRN

## 2014-09-02 MED ORDER — ONDANSETRON HCL 4 MG/2ML IJ SOLN
4.0000 mg | INTRAMUSCULAR | Status: DC | PRN
  Administered 2014-09-02 – 2014-09-03 (×2): 4 mg via INTRAVENOUS
  Filled 2014-09-02: qty 2

## 2014-09-02 MED ORDER — HYDROMORPHONE HCL 2 MG/ML IJ SOLN
INTRAMUSCULAR | Status: AC
  Filled 2014-09-02: qty 1

## 2014-09-02 MED ORDER — HYDROMORPHONE HCL 1 MG/ML IJ SOLN
0.5000 mg | INTRAMUSCULAR | Status: DC | PRN
  Administered 2014-09-02 – 2014-09-03 (×2): 1 mg via INTRAVENOUS
  Filled 2014-09-02 (×2): qty 1

## 2014-09-02 MED ORDER — EPHEDRINE SULFATE 50 MG/ML IJ SOLN
INTRAMUSCULAR | Status: DC | PRN
  Administered 2014-09-02 (×3): 10 mg via INTRAVENOUS

## 2014-09-02 MED ORDER — ATENOLOL 25 MG PO TABS
25.0000 mg | ORAL_TABLET | Freq: Every day | ORAL | Status: DC
  Administered 2014-09-03: 25 mg via ORAL
  Filled 2014-09-02 (×2): qty 1

## 2014-09-02 MED ORDER — EPHEDRINE SULFATE 50 MG/ML IJ SOLN
INTRAMUSCULAR | Status: AC
  Filled 2014-09-02: qty 1

## 2014-09-02 MED ORDER — HYDROMORPHONE HCL 1 MG/ML IJ SOLN
INTRAMUSCULAR | Status: DC | PRN
  Administered 2014-09-02 (×3): 0.5 mg via INTRAVENOUS

## 2014-09-02 MED ORDER — MANNITOL 25 % IV SOLN
25.0000 g | Freq: Once | INTRAVENOUS | Status: AC
Start: 2014-09-02 — End: 2014-09-02
  Administered 2014-09-02 (×2): 12.5 g via INTRAVENOUS
  Filled 2014-09-02: qty 100

## 2014-09-02 MED ORDER — STERILE WATER FOR IRRIGATION IR SOLN
Status: DC | PRN
  Administered 2014-09-02: 3000 mL

## 2014-09-02 MED ORDER — HYDROMORPHONE HCL 1 MG/ML IJ SOLN
INTRAMUSCULAR | Status: AC
  Filled 2014-09-02: qty 1

## 2014-09-02 SURGICAL SUPPLY — 70 items
APPLICATOR SURGIFLO ENDO (HEMOSTASIS) ×3 IMPLANT
CABLE HIGH FREQUENCY MONO STRZ (ELECTRODE) ×3 IMPLANT
CHLORAPREP W/TINT 26ML (MISCELLANEOUS) ×3 IMPLANT
CLIP LIGATING HEM O LOK PURPLE (MISCELLANEOUS) ×3 IMPLANT
CLIP LIGATING HEMO LOK XL GOLD (MISCELLANEOUS) IMPLANT
CLIP LIGATING HEMO O LOK GREEN (MISCELLANEOUS) ×6 IMPLANT
CLIP SUT LAPRA TY ABSORB (SUTURE) ×3 IMPLANT
CORDS BIPOLAR (ELECTRODE) ×3 IMPLANT
COVER SURGICAL LIGHT HANDLE (MISCELLANEOUS) ×3 IMPLANT
COVER TIP SHEARS 8 DVNC (MISCELLANEOUS) ×1 IMPLANT
COVER TIP SHEARS 8MM DA VINCI (MISCELLANEOUS) ×2
CUTTER ECHEON FLEX ENDO 45 340 (ENDOMECHANICALS) IMPLANT
DECANTER SPIKE VIAL GLASS SM (MISCELLANEOUS) IMPLANT
DRAIN CHANNEL 15F RND FF 3/16 (WOUND CARE) ×3 IMPLANT
DRAPE INCISE IOBAN 66X45 STRL (DRAPES) ×3 IMPLANT
DRAPE LAPAROSCOPIC ABDOMINAL (DRAPES) ×3 IMPLANT
DRAPE SHEET LG 3/4 BI-LAMINATE (DRAPES) ×3 IMPLANT
DRAPE TABLE BACK 44X90 PK DISP (DRAPES) ×3 IMPLANT
DRAPE WARM FLUID 44X44 (DRAPE) ×3 IMPLANT
DRSG TEGADERM 4X4.75 (GAUZE/BANDAGES/DRESSINGS) ×6 IMPLANT
DRSG TEGADERM 8X12 (GAUZE/BANDAGES/DRESSINGS) ×3 IMPLANT
ELECT REM PT RETURN 9FT ADLT (ELECTROSURGICAL) ×3
ELECTRODE REM PT RTRN 9FT ADLT (ELECTROSURGICAL) ×1 IMPLANT
EVACUATOR SILICONE 100CC (DRAIN) ×3 IMPLANT
GLOVE BIO SURGEON STRL SZ 6.5 (GLOVE) ×2 IMPLANT
GLOVE BIO SURGEONS STRL SZ 6.5 (GLOVE) ×1
GLOVE BIOGEL M STRL SZ7.5 (GLOVE) ×6 IMPLANT
GOWN STRL REUS W/TWL LRG LVL3 (GOWN DISPOSABLE) ×6 IMPLANT
HEMOSTAT SURGICEL 4X8 (HEMOSTASIS) ×3 IMPLANT
KIT ACCESSORY DA VINCI DISP (KITS) ×2
KIT ACCESSORY DVNC DISP (KITS) ×1 IMPLANT
KIT BASIN OR (CUSTOM PROCEDURE TRAY) ×3 IMPLANT
LIQUID BAND (GAUZE/BANDAGES/DRESSINGS) ×3 IMPLANT
LOOP VESSEL MAXI BLUE (MISCELLANEOUS) ×3 IMPLANT
NEEDLE INSUFFLATION 14GA 120MM (NEEDLE) ×3 IMPLANT
NS IRRIG 1000ML POUR BTL (IV SOLUTION) IMPLANT
PEN SKIN MARKING BROAD (MISCELLANEOUS) ×3 IMPLANT
PENCIL BUTTON HOLSTER BLD 10FT (ELECTRODE) ×3 IMPLANT
PORT ACCESS TROCAR AIRSEAL 12 (TROCAR) ×1 IMPLANT
PORT ACCESS TROCAR AIRSEAL 5M (TROCAR) ×2
POSITIONER SURGICAL ARM (MISCELLANEOUS) ×6 IMPLANT
POUCH SPECIMEN RETRIEVAL 10MM (ENDOMECHANICALS) ×6 IMPLANT
RELOAD WH ECHELON 45 (STAPLE) IMPLANT
SET TRI-LUMEN FLTR TB AIRSEAL (TUBING) ×3 IMPLANT
SET TUBE IRRIG SUCTION NO TIP (IRRIGATION / IRRIGATOR) IMPLANT
SOLUTION ELECTROLUBE (MISCELLANEOUS) ×3 IMPLANT
SPONGE DRAIN TRACH 4X4 STRL 2S (GAUZE/BANDAGES/DRESSINGS) ×3 IMPLANT
SPONGE LAP 4X18 X RAY DECT (DISPOSABLE) ×3 IMPLANT
SURGIFLO W/THROMBIN 8M KIT (HEMOSTASIS) ×3 IMPLANT
SUT ETHILON 3 0 PS 1 (SUTURE) ×3 IMPLANT
SUT MNCRL AB 4-0 PS2 18 (SUTURE) ×6 IMPLANT
SUT PDS AB 1 CT1 27 (SUTURE) ×6 IMPLANT
SUT V-LOC BARB 180 2/0GR6 GS22 (SUTURE)
SUT VIC AB 0 CT1 27 (SUTURE) ×8
SUT VIC AB 0 CT1 27XBRD ANTBC (SUTURE) ×4 IMPLANT
SUT VIC AB 2-0 SH 27 (SUTURE) ×2
SUT VIC AB 2-0 SH 27X BRD (SUTURE) ×1 IMPLANT
SUT VICRYL 0 UR6 27IN ABS (SUTURE) ×6 IMPLANT
SUT VLOC BARB 180 ABS3/0GR12 (SUTURE) ×3
SUTURE V-LC BRB 180 2/0GR6GS22 (SUTURE) IMPLANT
SUTURE VLOC BRB 180 ABS3/0GR12 (SUTURE) ×1 IMPLANT
TOWEL OR 17X26 10 PK STRL BLUE (TOWEL DISPOSABLE) ×6 IMPLANT
TOWEL OR NON WOVEN STRL DISP B (DISPOSABLE) ×3 IMPLANT
TRAY FOLEY W/METER SILVER 14FR (SET/KITS/TRAYS/PACK) ×3 IMPLANT
TRAY LAPAROSCOPIC (CUSTOM PROCEDURE TRAY) ×3 IMPLANT
TROCAR 12M 150ML BLUNT (TROCAR) ×3 IMPLANT
TROCAR BLADELESS OPT 5 100 (ENDOMECHANICALS) IMPLANT
TROCAR UNIVERSAL OPT 12M 100M (ENDOMECHANICALS) ×3 IMPLANT
TROCAR XCEL 12X100 BLDLESS (ENDOMECHANICALS) ×3 IMPLANT
WATER STERILE IRR 1500ML POUR (IV SOLUTION) IMPLANT

## 2014-09-02 NOTE — Anesthesia Preprocedure Evaluation (Addendum)
Anesthesia Evaluation  Patient identified by MRN, date of birth, ID band Patient awake    Reviewed: Allergy & Precautions, H&P , NPO status , Patient's Chart, lab work & pertinent test results, reviewed documented beta blocker date and time   Airway Mallampati: II  TM Distance: >3 FB Neck ROM: full    Dental no notable dental hx. (+) Teeth Intact, Dental Advisory Given   Pulmonary neg pulmonary ROS,  breath sounds clear to auscultation  Pulmonary exam normal       Cardiovascular Exercise Tolerance: Good hypertension, Pt. on home beta blockers Normal cardiovascular examRhythm:regular Rate:Normal     Neuro/Psych Bipolar Disorder TIA 2011 TIAnegative neurological ROS  negative psych ROS   GI/Hepatic negative GI ROS, Neg liver ROS,   Endo/Other  negative endocrine ROS  Renal/GU Renal diseaseHistory ARF. Renal Ca  negative genitourinary   Musculoskeletal   Abdominal   Peds  Hematology negative hematology ROS (+)   Anesthesia Other Findings   Reproductive/Obstetrics negative OB ROS                            Anesthesia Physical Anesthesia Plan  ASA: II  Anesthesia Plan: General   Post-op Pain Management:    Induction: Intravenous  Airway Management Planned: Oral ETT  Additional Equipment:   Intra-op Plan:   Post-operative Plan: Extubation in OR  Informed Consent: I have reviewed the patients History and Physical, chart, labs and discussed the procedure including the risks, benefits and alternatives for the proposed anesthesia with the patient or authorized representative who has indicated his/her understanding and acceptance.   Dental Advisory Given  Plan Discussed with: CRNA and Surgeon  Anesthesia Plan Comments:         Anesthesia Quick Evaluation

## 2014-09-02 NOTE — Anesthesia Postprocedure Evaluation (Signed)
  Anesthesia Post-op Note  Patient: [name]  Procedure(s) Performed: Procedure(s) (LRB): ROBOTIC ASSITED PARTIAL NEPHRECTOMY, intra-op ultrasound (Left)  Patient Location: PACU  Anesthesia Type: General  Level of Consciousness: awake and alert   Airway and Oxygen Therapy: Patient Spontanous Breathing  Post-op Pain: mild  Post-op Assessment: Post-op Vital signs reviewed, Patient's Cardiovascular Status Stable, Respiratory Function Stable, Patent Airway and No signs of Nausea or vomiting  Last Vitals:  Filed Vitals:   09/02/14 1745  BP: 138/92  Pulse: 74  Temp:   Resp: 15    Post-op Vital Signs: stable   Complications: No apparent anesthesia complications

## 2014-09-02 NOTE — Transfer of Care (Signed)
Immediate Anesthesia Transfer of Care Note  Patient: [name]  Procedure(s) Performed: Procedure(s): ROBOTIC ASSITED PARTIAL NEPHRECTOMY (Left)  Patient Location: PACU  Anesthesia Type:General  Level of Consciousness: awake, alert , oriented and patient cooperative  Airway & Oxygen Therapy: Patient Spontanous Breathing and Patient connected to face mask oxygen  Post-op Assessment: Report given to RN, Post -op Vital signs reviewed and stable and Patient moving all extremities  Post vital signs: Reviewed and stable  Last Vitals:  Filed Vitals:   09/02/14 1037  BP: 116/84  Pulse: 93  Temp: 36.4 C  Resp: 18    Complications: No apparent anesthesia complications

## 2014-09-02 NOTE — H&P (Signed)
[name] is an 44 y.o. male.    Chief Complaint: Pre-OP Left Robotic Partial Nephrectomy  HPI:   1 - Left Renal Mass - 3cm left lower pole 50% exophytic enhancing renal mass incidental during hospitalization 04/2014 for pneumonia and dehydration. Dedicated MRI confirms very concerning for primary renal malignancy. CXR w/o masses. 1 artey / 1 vein left renovascular anatomy, no additional lesions.  PMH sig for obesity, questionable TIA. HIs PCP has been  Delman Cheadle MD. He works in IT.   Today "[name]" is seen to proceed with left robotic partial nephrectomy.   Past Medical History  Diagnosis Date  . Hypertension   . Pneumonia 04/2014  . History of TIA (transient ischemic attack) 04/2009    numbness left pinky finger tip  . Bipolar disorder   . GERD (gastroesophageal reflux disease)     occasional  . History of kidney stones     x1 3 years ago  . Headache     hx of, none recently  . Cancer     "most likely left renal cancer"    Past Surgical History  Procedure Laterality Date  . No past surgeries      Family History  Problem Relation Age of Onset  . Diabetes Mother   . Hypertension Mother   . Diabetes Father   . Hypertension Father   . Hypertension Sister    Social History:  reports that he has never smoked. He has never used smokeless tobacco. He reports that he drinks alcohol. He reports that he does not use illicit drugs.  Allergies:  Allergies  Allergen Reactions  . Codeine Other (See Comments)    dizzy  . Hydrocodone-Acetaminophen Other (See Comments)    dizzy    No prescriptions prior to admission    Results for orders placed or performed during the hospital encounter of 08/31/14 (from the past 48 hour(s))  CBC     Status: Abnormal   Collection Time: 08/31/14  1:55 PM  Result Value Ref Range   WBC 12.3 (H) 4.0 - 10.5 K/uL   RBC 5.27 4.22 - 5.81 MIL/uL   Hemoglobin 16.7 13.0 - 17.0 g/dL   HCT 47.3 39.0 - 52.0 %   MCV 89.8 78.0 - 100.0 fL   MCH 31.7 26.0  - 34.0 pg   MCHC 35.3 30.0 - 36.0 g/dL   RDW 13.6 11.5 - 15.5 %   Platelets 275 150 - 400 K/uL  Basic metabolic panel     Status: None   Collection Time: 08/31/14  1:55 PM  Result Value Ref Range   Sodium 138 135 - 145 mmol/L   Potassium 4.0 3.5 - 5.1 mmol/L   Chloride 106 101 - 111 mmol/L   CO2 24 22 - 32 mmol/L   Glucose, Bld 92 65 - 99 mg/dL   BUN 17 6 - 20 mg/dL   Creatinine, Ser 0.83 0.61 - 1.24 mg/dL   Calcium 9.1 8.9 - 10.3 mg/dL   GFR calc non Af Amer >60 >60 mL/min   GFR calc Af Amer >60 >60 mL/min    Comment: (NOTE) The eGFR has been calculated using the CKD EPI equation. This calculation has not been validated in all clinical situations. eGFR's persistently <60 mL/min signify possible Chronic Kidney Disease.    Anion gap 8 5 - 15  Type and screen     Status: None   Collection Time: 08/31/14  1:55 PM  Result Value Ref Range   ABO/RH(D) B POS  Antibody Screen NEG    Sample Expiration 09/14/2014   ABO/Rh     Status: None   Collection Time: 08/31/14  2:00 PM  Result Value Ref Range   ABO/RH(D) B POS    Dg Chest 2 View  08/31/2014   CLINICAL DATA:  Preoperative exam prior to a robotically assisted partial nephrectomy, history of hypertension  EXAM: CHEST  2 VIEW  COMPARISON:  PA and lateral chest of May 15, 2014  FINDINGS: The lungs are adequately inflated. There is linear density in the retro sternal region most compatible with scarring pre aerated previously demonstrated increased interstitial markings have resolved. The heart and pulmonary vascularity are normal. The mediastinum is normal in width. The trachea is midline. The bony thorax is unremarkable.  IMPRESSION: There is no active cardiopulmonary disease.   Electronically Signed   By: David  Martinique M.D.   On: 08/31/2014 15:21    Review of Systems  Constitutional: Negative.  Negative for fever and chills.  HENT: Negative.   Eyes: Negative.   Respiratory: Negative.   Cardiovascular: Negative.    Gastrointestinal: Negative.   Genitourinary: Negative.   Musculoskeletal: Negative.   Skin: Negative.   Neurological: Negative.   Endo/Heme/Allergies: Negative.   Psychiatric/Behavioral: Negative.     There were no vitals taken for this visit. Physical Exam  Constitutional: He appears well-developed.  HENT:  Head: Normocephalic.  Eyes: Pupils are equal, round, and reactive to light.  Neck: Normal range of motion.  Cardiovascular: Normal rate.   Respiratory: Effort normal.  GI: Soft.  Genitourinary:  No CVAT  Musculoskeletal: Normal range of motion.  Neurological: He is alert.  Skin: Skin is warm.  Psychiatric: He has a normal mood and affect. His behavior is normal. Judgment and thought content normal.     Assessment/Plan  1 - Left Renal Mass - highly concerning for early left renal cell carcinoma.    We rediscussed the role of partial nephrectomy with the overall goals being a balance of trying to achieve complete surgical excision (negative margins) while minimizing loss of normally functioning kidney. We then rediscussed surgical approaches including robotic and open techniques with robotic associated with a shorter convalescence. I showed the patient on their abdomen the approximately 4-6 incision (trocar) sites as well as presumed extraction sites with robotic approach as well as possible open incision sites. We specifically readdressed that there may be need to alter operative plans according to intraopertive findings including conversion to open procedure or conversion to radical nephrectomy as well as need for adjunctive procedures such as ureteral stenting to promote correct renal healing. We rediscussed specific peri-operative risks including bleeding, infection, deep vein thrombosis, pulmonary embolism, compartment syndrome, neuropathy / neuropraxia, heart attack, stroke, death, as well as long-term risks such as non-cure / need for additional therapy and need for imaging  and lab based post-op surveillance protocols. We rediscussed typical hospital course of approximately 2 day hospitalization, need for peri-operative drains / catheters, and typical post-hospital course with return to most non-strenuous activities by 2 weeks and ability to return to most jobs and more strenuous activity such as exercise by 6 weeks.   After this lengthy and detail discussion, including answering all of the patient's questions to their satisfaction, they have chosen to proceed today as planned.  Brooklynn Brandenburg 09/02/2014, 6:08 AM

## 2014-09-02 NOTE — Brief Op Note (Signed)
09/02/2014  4:37 PM  PATIENT:  [name]  44 y.o. male  PRE-OPERATIVE DIAGNOSIS:  LEFT RENAL MASS  POST-OPERATIVE DIAGNOSIS:  LEFT RENAL MASS  PROCEDURE:  Procedure(s): ROBOTIC ASSITED PARTIAL NEPHRECTOMY (Left)  SURGEON:  Surgeon(s) and Role:    * Alexis Frock, MD - Primary  PHYSICIAN ASSISTANT:   ASSISTANTS: 1 - Clemetine Marker, PA; 2 - Gypsy Lore MD   ANESTHESIA:   local and general  EBL:  Total I/O In: 2000 [I.V.:2000] Out: 230 [Urine:130; Blood:100]  BLOOD ADMINISTERED:none  DRAINS: 1 - foley to straight drain; 2 - JP to bulb   LOCAL MEDICATIONS USED:  MARCAINE     SPECIMEN:  Source of Specimen:  1 - left renal mass, 2 - fat around left renal mass  DISPOSITION OF SPECIMEN:  PATHOLOGY  COUNTS:  YES  TOURNIQUET:  * No tourniquets in log *  DICTATION: .Other Dictation: Dictation Number [id number]  PLAN OF CARE: Admit to inpatient   PATIENT DISPOSITION:  PACU - hemodynamically stable.   Delay start of Pharmacological VTE agent (>24hrs) due to surgical blood loss or risk of bleeding: yes

## 2014-09-02 NOTE — Anesthesia Procedure Notes (Signed)
Procedure Name: Intubation Date/Time: 09/02/2014 1:45 PM Performed by: Danley Danker L Patient Re-evaluated:Patient Re-evaluated prior to inductionOxygen Delivery Method: Circle system utilized Preoxygenation: Pre-oxygenation with 100% oxygen Intubation Type: IV induction Ventilation: Mask ventilation without difficulty and Oral airway inserted - appropriate to patient size Laryngoscope Size: Miller and 3 Grade View: Grade I Tube type: Oral Tube size: 8.0 mm Number of attempts: 1 Airway Equipment and Method: Stylet Placement Confirmation: ETT inserted through vocal cords under direct vision,  positive ETCO2 and breath sounds checked- equal and bilateral Secured at: 21 cm Tube secured with: Tape Dental Injury: Teeth and Oropharynx as per pre-operative assessment

## 2014-09-02 NOTE — Discharge Instructions (Signed)

## 2014-09-03 ENCOUNTER — Encounter (HOSPITAL_COMMUNITY): Payer: Self-pay | Admitting: Urology

## 2014-09-03 LAB — HEMOGLOBIN AND HEMATOCRIT, BLOOD
HCT: 43.8 % (ref 39.0–52.0)
HEMOGLOBIN: 15 g/dL (ref 13.0–17.0)

## 2014-09-03 LAB — BASIC METABOLIC PANEL
Anion gap: 8 (ref 5–15)
BUN: 22 mg/dL — ABNORMAL HIGH (ref 6–20)
CALCIUM: 8.6 mg/dL — AB (ref 8.9–10.3)
CO2: 24 mmol/L (ref 22–32)
CREATININE: 1.19 mg/dL (ref 0.61–1.24)
Chloride: 103 mmol/L (ref 101–111)
GFR calc Af Amer: >60 mL/min (ref >60)
GFR calc non Af Amer: >60 mL/min (ref >60)
GLUCOSE: 166 mg/dL — AB (ref 65–99)
POTASSIUM: 3.9 mmol/L (ref 3.5–5.1)
Sodium: 135 mmol/L (ref 135–145)

## 2014-09-03 LAB — CREATININE, FLUID (PLEURAL, PERITONEAL, JP DRAINAGE): CREAT FL: 1.1 mg/dL

## 2014-09-03 MED ORDER — SENNOSIDES-DOCUSATE SODIUM 8.6-50 MG PO TABS: 1.0000 | ORAL_TABLET | Freq: Two times a day (BID) | ORAL | Status: DC

## 2014-09-03 MED ORDER — OXYCODONE HCL 5 MG PO TABS: 5.0000 mg | ORAL_TABLET | ORAL | Status: DC | PRN

## 2014-09-03 NOTE — Discharge Summary (Signed)
Physician Discharge Summary  Patient ID: [name] MRN: [id number] DOB/AGE: 43-07-1970 43 y.o.  Admit date: 09/02/2014 Discharge date: 09/03/2014  Admission Diagnoses:  Discharge Diagnoses:  Active Problems:   Renal mass   Discharged Condition: good  Hospital Course:   1 - Left Renal Mass - Pt underwent left robotic assisted partial nephrectomy on 09/02/2014 the day of admission without acute complications. Admitted to the 4th floor urology service post-op. By POD 1, the day of discharge, he is ambulatory, pain controlled on PO meds, voiding w/o catheter and JP drain removed as Cr same as serum. Hgb stable.  Consults: None  Significant Diagnostic Studies: labs: JP Cr 1.1. Surgical pathology - pending  Treatments: surgery: left robotic assisted partial nephrectomy on 09/02/2014   Discharge Exam: Blood pressure 128/82, pulse 72, temperature 97.6 F (36.4 C), temperature source Oral, resp. rate 20, height 5\' 8"  (1.727 m), weight 115.214 kg (254 lb), SpO2 96 %. General appearance: alert, cooperative and appears stated age Eyes: negative Nose: Nares normal. Septum midline. Mucosa normal. No drainage or sinus tenderness. Throat: lips, mucosa, and tongue normal; teeth and gums normal Neck: supple, symmetrical, trachea midline Back: symmetric, no curvature. ROM normal. No CVA tenderness. Resp: non-labored on room air Cardio: nl rate GI: soft, non-tender; bowel sounds normal; no masses,  no organomegaly Male genitalia: normal Extremities: extremities normal, atraumatic, no cyanosis or edema Pulses: 2+ and symmetric Skin: Skin color, texture, turgor normal. No rashes or lesions Neurologic: Grossly normal Incision/Wound: recent port sites c/d/i. JP removed and dry dressing applied.   Disposition: 01-Home or Self Care     Medication List    STOP taking these medications        dextromethorphan-guaiFENesin 30-600 MG per 12 hr tablet  Commonly known as:  MUCINEX DM     meclizine  25 MG tablet  Commonly known as:  ANTIVERT     ranitidine 150 MG tablet  Commonly known as:  ZANTAC      TAKE these medications        albuterol 108 (90 BASE) MCG/ACT inhaler  Commonly known as:  PROVENTIL HFA;VENTOLIN HFA  Inhale 2 puffs into the lungs every 6 (six) hours as needed for wheezing or shortness of breath.     atenolol 25 MG tablet  Commonly known as:  TENORMIN  Take 1 tablet (25 mg total) by mouth daily.     hydrOXYzine 25 MG tablet  Commonly known as:  ATARAX/VISTARIL  TAKE 1/2 TO 1  TABLETS (12.5 MG TO 25 MG TOTAL) BY MOUTH EVERY 8  HOURS AS NEEDED FOR ANXIETY.     oxyCODONE 5 MG immediate release tablet  Commonly known as:  Oxy IR/ROXICODONE  Take 1-2 tablets (5-10 mg total) by mouth every 4 (four) hours as needed for moderate pain or severe pain. Post-operatively     senna-docusate 8.6-50 MG per tablet  Commonly known as:  Senokot-S  Take 1 tablet by mouth 2 (two) times daily. While taking pain meds to prevent constipation     traZODone 50 MG tablet  Commonly known as:  DESYREL  Take 50 mg by mouth at bedtime as needed for sleep.     VISINE OP  Apply 2 drops to eye as needed (redness).           Follow-up Information    Follow up with Alexis Frock, MD On 09/21/2014.   Specialty:  Urology   Why:  at 11:45   Contact information:   [address]  [place] [address] [phone]       Signed: Alexis Frock 09/03/2014, 4:42 PM

## 2014-09-03 NOTE — Op Note (Signed)
[name], [name] NO.:  [id number]  MEDICAL RECORD NO.:  [id number]  LOCATION:  7741                         FACILITY:  [facility]  PHYSICIAN:  Alexis Frock, MD     DATE OF BIRTH:  [birth year]  DATE OF PROCEDURE: 09/02/2014                              OPERATIVE REPORT  DIAGNOSIS:  Left renal mass.  PROCEDURES: 1. Robotic-assisted laparoscopic left partial nephrectomy. 2. Intraoperative ultrasound with interpretation.  ASSISTANT: 1. Clemetine Marker, PA. 2. Gypsy Lore, MD.  ESTIMATED BLOOD LOSS:  100 mL.  WARM ISCHEMIA TIME:  17 minutes.  COMPLICATIONS:  None.  SPECIMENS: 1. Left renal mass. 2. Fat around left renal mass.  FINDINGS: 1. Extensive perinephric dense adiposity. 2. Single artery, single vein, left renovascular anatomy. 3. Approximately 50% exophytic predominantly solid, but with cystic     component left renal mass by intraoperative ultrasound.  DRAINS: 1. Jackson-Pratt drain to bulb suction. 2. Foley catheter to straight drain.  INDICATION:  [name] is a pleasant 44 year old gentleman, who was found incidentally to have an enhancing left renal mass approximately 3 cm, was clinically localized by staging imaging.  Options were discussed with the patient including surveillance protocols versus ablative therapies versus surgical extirpation with and without minimally invasive assistance and with and without nephron sparing, and he wished to proceed with left robotic partial nephrectomy.  Informed consent was obtained and placed in the medical record.  PROCEDURE IN DETAIL:  The patient being, [name], was verified. Procedure being left robotic partial nephrectomy was confirmed. Procedure was carried out.  Time-out was performed.  Intravenous antibiotics were administered.  General endotracheal anesthesia was introduced.  The patient was placed into a left side up full flank position, employing 15 degrees of table flexion and  superior arm elevator, sequential compression devices, beanbag and axillary roll.  He was further fashioned to the operative table using 3-inch tape over foam padding across his chest and pelvis.  Bony prominences were suitably padded.  A sterile field was then created by prepping and draping the patient's entire left flank using chlorhexidine gluconate after Foley catheter was placed per urethra to straight drain.  Next, a high-flow, low-pressure pneumoperitoneum was obtained using Veress technique in the left lower quadrant having passed the aspiration and drop test.  Next, a 12-mm robotic camera port was placed and positioned approximately 4 fingerbreadths superolateral to the umbilicus.  Laparoscopic examination of the peritoneal cavity revealed no significant adhesions and no visceral injury.  Additional ports were then placed as follows:  The left subcostal superior 8-mm robotic port, left far lateral 8-mm robotic port approximately 1 handbreadth superior medial to the anterior superior iliac spine.  A left paramedian inferior robotic port approximately 1 handbreadth superior to the pubic symphysis.  A 12-mm air-seal assistant port in the paramedian location approximately 3 fingerbreadths below the camera port and a 12-mm assistant port approximately 3 fingerbreadths above the camera port.  Robot was docked and passed through the electronic checks.  Initial attention was directed to the development of retroperitoneum.  Incision was made lateral to the descending colon from the area of the splenic flexure inferiorly towards the area of the internal  ring and the colon was very carefully swept medially.  The anterior surface of Gerota's fascia was identified and mesenteric fat was carefully swept away from this.  The tail of pancreas was identified and also swept away from the anterior surface of Gerota's and again, additional posterior peritoneal incision was made allowing the spleen  to rotate medially, self-retracting.  Lower pole of the kidney was identified and placed on gentle lateral traction. Dissection proceeded medial to this.  The gonadal vein was encountered and traced superiorly towards the area of the renal vein.  Renal vein and renal artery were circumferentially mobilized and they appeared to be [facility] each.  The vessel loop was applied around the single left renal artery.  Next, incision was made through the perinephric fat in the lateral position directly onto the anterior lateral surface of the kidney.  There was copious retroperitoneal adiposity.  The anterior lateral surface of the kidney was identified and defatted and the area of the mass in question was identified grossly.  There was extensive desmoplastic fat overlying this area and a wide swath of this overlying the mass was dissected away from the mass and set aside for permanent pathology and labeled that around renal tumor.  Next, the mass was interrogated using intraoperative ultrasound via laparoscopic probe.  Intraoperative ultrasound revealed a dominant anterior lateral left renal mass that was predominantly solid and well circumscribed.  There were several smaller cystic components within the lesion, it appeared to be approximately 50% exophytic based on intraoperative ultrasound.  Using intraoperative ultrasound guidance and gross visualization, the edges of this were marked for partial nephrectomy.  Next, warm ischemia was obtained using two laparoscopic bulldog clamps on the renal artery and very careful partial nephrectomy was performed using cold scissors keeping what appeared to be a small rim of normal parenchyma with the partial nephrectomy specimen.  This was carefully placed into an EndoCatch bag for later retrieval.  Initially layer renorrhaphy was performed using a single layer of running 3-0 V-Loc suture, oversewing several small vessels.  Next, a bolster was applied, a  Surgicel onto the defect and three interrupted 0 Vicryl sutures were placed as parenchymal apposition sutures and tensioned, such that they were sandwiched between Hem-o-lok clips and lapper ties.  Renal artery was unclamped for total warm ischemia time of 17 minutes.  The partial nephrectomy bed was once again inspected and found to be suitably hemostatic.  A 10 mL of FloSeal were applied to this area.  All sponge and needle counts were correct and hemostasis appeared excellent.  Robot was then undocked.  Closed suction drain was brought through the previous left lateral most robotic port site.  The camera port site and superior assistant port site were closed at the level of fascia using Carter-Thomason suture passer under laparoscopic vision, 0 Vicryl.  The specimen was retrieved by extending slightly the inferior most assistant port sites removing the two specimens and setting aside for permanent pathology.  The extraction site was closed at the level of fascia using figure-of-eight Vicryl x2. All incision sites were infiltrated with dilute lyophilized Marcaine and closed at the level of the skin using subcuticular Monocryl followed by Dermabond.  Procedure was then terminated.  The patient tolerated the procedure well.  There were no immediate periprocedural complications. The patient was taken to the postanesthesia care unit in stable condition.          ______________________________ Alexis Frock, MD     TM/MEDQ  D:  09/02/2014  T:  09/03/2014  Job:  [id number]

## 2014-09-03 NOTE — Progress Notes (Signed)
Pt discharged to home.  Denies pain.  Pt stable.  Pt has all belongings.  Discussed discharge instructions.  No further questions.    Iantha Fallen RN 1700  09/03/2014

## 2014-09-03 NOTE — Care Management Note (Signed)
Case Management Note  Patient Details  Name: [name] MRN: [id number] Date of Birth: Aug 02, 1970  Subjective/Objective:   44 y/o m admitted w/Renal Mass. From home.                 Action/Plan:d/c plan home.  Expected Discharge Date:                  Expected Discharge Plan:  Home/Self Care  In-House Referral:     Discharge planning Services  CM Consult  Post Acute Care Choice:    Choice offered to:     DME Arranged:    DME Agency:     HH Arranged:    HH Agency:     Status of Service:  In process, will continue to follow  Medicare Important Message Given:    Date Medicare IM Given:    Medicare IM give by:    Date Additional Medicare IM Given:    Additional Medicare Important Message give by:     If discussed at [place] of Stay Meetings, dates discussed:    Additional Comments:  Dessa Phi, RN 09/03/2014, 3:04 PM

## 2014-09-23 DIAGNOSIS — C642 Malignant neoplasm of left kidney, except renal pelvis: Secondary | ICD-10-CM | POA: Insufficient documentation

## 2014-11-05 ENCOUNTER — Ambulatory Visit (INDEPENDENT_AMBULATORY_CARE_PROVIDER_SITE_OTHER): Payer: BLUE CROSS/BLUE SHIELD | Admitting: Physician Assistant

## 2014-11-05 VITALS — BP 178/128 | HR 81 | Temp 98.9°F | Resp 16 | Ht 68.0 in | Wt 248.0 lb

## 2014-11-05 DIAGNOSIS — L0291 Cutaneous abscess, unspecified: Secondary | ICD-10-CM | POA: Diagnosis not present

## 2014-11-05 DIAGNOSIS — I10 Essential (primary) hypertension: Secondary | ICD-10-CM | POA: Diagnosis not present

## 2014-11-05 LAB — POCT CBC
Granulocyte percent: 68.1 % (ref 37–80)
HCT, POC: 49.7 % (ref 43.5–53.7)
Hemoglobin: 15.2 g/dL (ref 14.1–18.1)
Lymph, poc: 2.2 (ref 0.6–3.4)
MCH, POC: 28.3 pg (ref 27–31.2)
MCHC: 30.6 g/dL — AB (ref 31.8–35.4)
MCV: 92.4 fL (ref 80–97)
MID (cbc): 0.8 (ref 0–0.9)
MPV: 7.8 fL (ref 0–99.8)
POC Granulocyte: 6.3 (ref 2–6.9)
POC LYMPH PERCENT: 23.7 % (ref 10–50)
POC MID %: 8.2 % (ref 0–12)
Platelet Count, POC: 258 10*3/uL (ref 142–424)
RBC: 5.37 M/uL (ref 4.69–6.13)
RDW, POC: 13.8 %
WBC: 9.2 10*3/uL (ref 4.6–10.2)

## 2014-11-05 LAB — POCT URINALYSIS DIPSTICK
BILIRUBIN UA: NEGATIVE
GLUCOSE UA: NEGATIVE
Ketones, UA: NEGATIVE
LEUKOCYTES UA: NEGATIVE
NITRITE UA: NEGATIVE
RBC UA: NEGATIVE
Spec Grav, UA: 1.025
Urobilinogen, UA: 1
pH, UA: 6

## 2014-11-05 LAB — POCT GLYCOSYLATED HEMOGLOBIN (HGB A1C): Hemoglobin A1C: 5.3

## 2014-11-05 MED ORDER — LISINOPRIL-HYDROCHLOROTHIAZIDE 10-12.5 MG PO TABS: 1.0000 | ORAL_TABLET | Freq: Every day | ORAL | Status: DC

## 2014-11-05 MED ORDER — ATENOLOL 25 MG PO TABS: 25.0000 mg | ORAL_TABLET | Freq: Every day | ORAL | Status: DC

## 2014-11-05 MED ORDER — DOXYCYCLINE HYCLATE 100 MG PO CAPS: 100.0000 mg | ORAL_CAPSULE | Freq: Two times a day (BID) | ORAL | Status: DC

## 2014-11-05 NOTE — Progress Notes (Signed)
11/05/2014 at [place] / DOB: 11/26/1970 / MRN: [id number]  The patient has Hypertension; CAP (community acquired pneumonia); Nausea vomiting and diarrhea; Mass of left kidney; Transaminitis; Acute renal insufficiency; Acute renal failure syndrome; Dehydration; Left kidney mass; and Renal mass on his problem list.  SUBJECTIVE  [name] is a 44 y.o. well appearing male presenting for the chief complaint of medication refill. He feels well today and denies complaint. He reports being out of his HTN medication for about 1 month now and has been taking only Atenolol for control.  He was once prescribed Prinzide but stopped this and does not know why.  Was recently hospitalized and had a partial nephrectomy for "kidney cancer." When asked why he did not ask for a refill of his HTN meds he reports "just being lazy I guess."  Denies anhedonia and depression.   He complains of a "cyst" on his right chest and reports this started a few days ago and is tender.  He has tried "to pop" the lesion without relief.    He  has a past medical history of Hypertension; Pneumonia (04/2014); History of TIA (transient ischemic attack) (04/2009); Bipolar disorder; GERD (gastroesophageal reflux disease); History of kidney stones; Headache; and Cancer.    Medications reviewed and updated by myself where necessary, and exist elsewhere in the encounter.   [name] is allergic to codeine and hydrocodone-acetaminophen. He  reports that he has never smoked. He has never used smokeless tobacco. He reports that he drinks alcohol. He reports that he does not use illicit drugs. He  has no sexual activity history on file. 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  His family history includes Diabetes in his father and mother; Hypertension in his father, mother, and sister.  Review of Systems  Constitutional: Negative for fever.    Eyes: Negative for blurred vision, double vision, photophobia and pain.  Gastrointestinal: Negative for nausea.  Skin: Negative for rash.  Neurological: Negative for dizziness and headaches.    OBJECTIVE  His  height is '5\' 8"'  (1.727 m) and weight is 248 lb (112.492 kg). His oral temperature is 98.9 F (37.2 C). His blood pressure is 178/128 and his pulse is 81. His respiration is 16 and oxygen saturation is 98%.  The patient's body mass index is 37.72 kg/(m^2).  Physical Exam  Constitutional: He is oriented to person, place, and time. He appears well-developed and well-nourished. No distress.  Cardiovascular: Normal rate, regular rhythm and intact distal pulses.  Exam reveals no gallop and no friction rub.   No murmur heard. Respiratory: Effort normal and breath sounds normal.  GI: Soft. Bowel sounds are normal.  Neurological: He is alert and oriented to person, place, and time. No cranial nerve deficit.  Skin: Skin is warm and dry. No rash noted. He is not diaphoretic. No pallor.  Psychiatric: He has a normal mood and affect.    Recent Results (from the past 2160 hour(s))  CBC     Status: Abnormal   Collection Time: 08/31/14  1:55 PM  Result Value Ref Range   WBC 12.3 (H) 4.0 - 10.5 K/uL   RBC 5.27 4.22 - 5.81 MIL/uL   Hemoglobin 16.7 13.0 - 17.0 g/dL   HCT 47.3 39.0 - 52.0 %   MCV 89.8 78.0 - 100.0 fL   MCH 31.7 26.0 - 34.0 pg   MCHC 35.3 30.0 - 36.0 g/dL  RDW 13.6 11.5 - 15.5 %   Platelets 275 150 - 400 K/uL  Basic metabolic panel     Status: None   Collection Time: 08/31/14  1:55 PM  Result Value Ref Range   Sodium 138 135 - 145 mmol/L   Potassium 4.0 3.5 - 5.1 mmol/L   Chloride 106 101 - 111 mmol/L   CO2 24 22 - 32 mmol/L   Glucose, Bld 92 65 - 99 mg/dL   BUN 17 6 - 20 mg/dL   Creatinine, Ser 0.83 0.61 - 1.24 mg/dL   Calcium 9.1 8.9 - 10.3 mg/dL   GFR calc non Af Amer >60 >60 mL/min   GFR calc Af Amer >60 >60 mL/min    Comment: (NOTE) The eGFR has been  calculated using the CKD EPI equation. This calculation has not been validated in all clinical situations. eGFR's persistently <60 mL/min signify possible Chronic Kidney Disease.    Anion gap 8 5 - 15  Type and screen     Status: None   Collection Time: 08/31/14  1:55 PM  Result Value Ref Range   ABO/RH(D) B POS    Antibody Screen NEG    Sample Expiration 09/05/2014   ABO/Rh     Status: None   Collection Time: 08/31/14  2:00 PM  Result Value Ref Range   ABO/RH(D) B POS   Hemoglobin and hematocrit, blood     Status: None   Collection Time: 09/02/14  5:12 PM  Result Value Ref Range   Hemoglobin 16.4 13.0 - 17.0 g/dL   HCT 47.1 39.0 - 10.1 %  Basic metabolic panel     Status: Abnormal   Collection Time: 09/03/14  5:00 AM  Result Value Ref Range   Sodium 135 135 - 145 mmol/L   Potassium 3.9 3.5 - 5.1 mmol/L   Chloride 103 101 - 111 mmol/L   CO2 24 22 - 32 mmol/L   Glucose, Bld 166 (H) 65 - 99 mg/dL   BUN 22 (H) 6 - 20 mg/dL   Creatinine, Ser 1.19 0.61 - 1.24 mg/dL   Calcium 8.6 (L) 8.9 - 10.3 mg/dL   GFR calc non Af Amer >60 >60 mL/min   GFR calc Af Amer >60 >60 mL/min    Comment: (NOTE) The eGFR has been calculated using the CKD EPI equation. This calculation has not been validated in all clinical situations. eGFR's persistently <60 mL/min signify possible Chronic Kidney Disease.    Anion gap 8 5 - 15  Hemoglobin and hematocrit, blood     Status: None   Collection Time: 09/03/14  5:00 AM  Result Value Ref Range   Hemoglobin 15.0 13.0 - 17.0 g/dL   HCT 43.8 39.0 - 52.0 %  Urology - JP creatinine     Status: None   Collection Time: 09/03/14  7:40 AM  Result Value Ref Range   Creat, Fluid 1.1 mg/dL    Comment: (NOTE) No normal range established for this test Results should be evaluated in conjunction with serum values Performed at [facility]    Fluid Type-FCRE JP DRAINAGE   POCT CBC     Status: Abnormal   Collection Time: 11/05/14  6:06 PM  Result Value  Ref Range   WBC 9.2 4.6 - 10.2 K/uL   Lymph, poc 2.2 0.6 - 3.4   POC LYMPH PERCENT 23.7 10 - 50 %L   MID (cbc) 0.8 0 - 0.9   POC MID % 8.2 0 - 12 %M  POC Granulocyte 6.3 2 - 6.9   Granulocyte percent 68.1 37 - 80 %G   RBC 5.37 4.69 - 6.13 M/uL   Hemoglobin 15.2 14.1 - 18.1 g/dL   HCT, POC 49.7 43.5 - 53.7 %   MCV 92.4 80 - 97 fL   MCH, POC 28.3 27 - 31.2 pg   MCHC 30.6 (A) 31.8 - 35.4 g/dL   RDW, POC 13.8 %   Platelet Count, POC 258 142 - 424 K/uL   MPV 7.8 0 - 99.8 fL  Comprehensive metabolic panel     Status: Abnormal   Collection Time: 11/05/14  6:06 PM  Result Value Ref Range   Sodium 141 135 - 146 mmol/L   Potassium 3.5 3.5 - 5.3 mmol/L   Chloride 107 98 - 110 mmol/L   CO2 25 20 - 31 mmol/L   Glucose, Bld 101 (H) 65 - 99 mg/dL   BUN 9 7 - 25 mg/dL   Creat 1.02 0.60 - 1.35 mg/dL   Total Bilirubin 0.7 0.2 - 1.2 mg/dL   Alkaline Phosphatase 110 40 - 115 U/L   AST 20 10 - 40 U/L   ALT 25 9 - 46 U/L   Total Protein 7.5 6.1 - 8.1 g/dL   Albumin 4.1 3.6 - 5.1 g/dL   Calcium 9.2 8.6 - 10.3 mg/dL    Comment: ** Please note change in unit of measure and reference range(s). **     POCT glycosylated hemoglobin (Hb A1C)     Status: None   Collection Time: 11/05/14  6:06 PM  Result Value Ref Range   Hemoglobin A1C 5.3   POCT urinalysis dipstick     Status: None   Collection Time: 11/05/14  6:06 PM  Result Value Ref Range   Color, UA yellow    Clarity, UA clear    Glucose, UA neg    Bilirubin, UA neg    Ketones, UA neg    Spec Grav, UA 1.025    Blood, UA neg    pH, UA 6.0    Protein, UA trace    Urobilinogen, UA 1.0    Nitrite, UA neg    Leukocytes, UA Negative Negative  Wound culture     Status: None (Preliminary result)   Collection Time: 11/05/14  6:30 PM  Result Value Ref Range   Preliminary Report Moderate STAPHYLOCOCCUS AUREUS     Comment: Rifampin and Gentamicin should not be used as single drugs for treatment of Staph infections.      ASSESSMENT &  PLAN  [place] was seen today for medication refill and hypertension.  Diagnoses and all orders for this visit:  Severe hypertension: Patient completely asymptomatic. Remarkably his labs show no evidence of end organ damage, giving the impression that this episode has had a short duration.  He is to RTC in one week for medication titration.   -     lisinopril-hydrochlorothiazide (PRINZIDE,ZESTORETIC) 10-12.5 MG per tablet; Take 1 tablet by mouth daily. -     atenolol (TENORMIN) 25 MG tablet; Take 1 tablet (25 mg total) by mouth daily. -     POCT CBC -     Comprehensive metabolic panel -     POCT glycosylated hemoglobin (Hb A1C) -     POCT urinalysis dipstick -     Wound culture  Abscess -     doxycycline (VIBRAMYCIN) 100 MG capsule; Take 1 capsule (100 mg total) by mouth 2 (two) times daily.    The patient was  advised to call or come back to clinic if he does not see an improvement in symptoms, or worsens with the above plan.   Philis Fendt, MHS, PA-C [facility] 11/05/2014 9:05 PM

## 2014-11-06 LAB — COMPREHENSIVE METABOLIC PANEL
ALT: 25 U/L (ref 9–46)
AST: 20 U/L (ref 10–40)
Albumin: 4.1 g/dL (ref 3.6–5.1)
Alkaline Phosphatase: 110 U/L (ref 40–115)
BUN: 9 mg/dL (ref 7–25)
CO2: 25 mmol/L (ref 20–31)
Calcium: 9.2 mg/dL (ref 8.6–10.3)
Chloride: 107 mmol/L (ref 98–110)
Creat: 1.02 mg/dL (ref 0.60–1.35)
Glucose, Bld: 101 mg/dL — ABNORMAL HIGH (ref 65–99)
POTASSIUM: 3.5 mmol/L (ref 3.5–5.3)
Sodium: 141 mmol/L (ref 135–146)
Total Bilirubin: 0.7 mg/dL (ref 0.2–1.2)
Total Protein: 7.5 g/dL (ref 6.1–8.1)

## 2014-11-09 LAB — WOUND CULTURE
GRAM STAIN: NONE SEEN
Gram Stain: NONE SEEN

## 2014-11-09 NOTE — Addendum Note (Signed)
Addended by: Roselee Culver on: 11/09/2014 08:56 PM   Modules accepted: Miquel Dunn

## 2014-11-09 NOTE — Progress Notes (Signed)
  Medical screening examination/treatment/procedure(s) were performed by non-physician practitioner and as supervising physician I was immediately available for consultation/collaboration.     

## 2014-11-12 ENCOUNTER — Ambulatory Visit (INDEPENDENT_AMBULATORY_CARE_PROVIDER_SITE_OTHER): Payer: BLUE CROSS/BLUE SHIELD | Admitting: Physician Assistant

## 2014-11-12 ENCOUNTER — Encounter: Payer: Self-pay | Admitting: Physician Assistant

## 2014-11-12 VITALS — BP 128/78 | HR 68 | Temp 97.0°F | Resp 16 | Ht 68.0 in | Wt 246.8 lb

## 2014-11-12 DIAGNOSIS — L0291 Cutaneous abscess, unspecified: Secondary | ICD-10-CM

## 2014-11-12 DIAGNOSIS — I10 Essential (primary) hypertension: Secondary | ICD-10-CM | POA: Diagnosis not present

## 2014-11-12 MED ORDER — LISINOPRIL-HYDROCHLOROTHIAZIDE 10-12.5 MG PO TABS: 1.0000 | ORAL_TABLET | Freq: Every day | ORAL | Status: DC

## 2014-11-12 NOTE — Progress Notes (Signed)
11/12/2014 at 7:43 PM  [name] / DOB: 02-09-1971 / MRN: [id number]  The patient has Hypertension; CAP (community acquired pneumonia); Nausea vomiting and diarrhea; Mass of left kidney; Transaminitis; Acute renal insufficiency; Acute renal failure syndrome; Dehydration; Left kidney mass; and Renal mass on his problem list.  SUBJECTIVE  [name] is a 44 y.o. well appearing male presenting for the chief complaint of HTN follow up and skin abscess on his left chest.    With regards to his HTN he remains asymptomatic and has been taking Lisionpril/HCTZ as prescribed without complication. He has a history of s/p partial nephrectomy and has a follow up with his surgeon in [place] 4 months. His last follow up was roughly 1 months ago and his surgeon reports everything was well.      With regards to his abscess, reports he has been taking doxycycline daily and feels the abscess has improved.    He  has a past medical history of Hypertension; Pneumonia (04/2014); History of TIA (transient ischemic attack) (04/2009); Bipolar disorder; GERD (gastroesophageal reflux disease); History of kidney stones; Headache; and Cancer.    Medications reviewed and updated by myself where necessary, and exist elsewhere in the encounter.   [name] is allergic to codeine and hydrocodone-acetaminophen. He  reports that he has never smoked. He has never used smokeless tobacco. He reports that he drinks alcohol. He reports that he does not use illicit drugs. He  has no sexual activity history on file. 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  His family history includes Diabetes in his father and mother; Hypertension in his father, mother, and sister.  Review of Systems  Constitutional: Negative for fever.  Eyes: Negative for blurred vision, double vision, photophobia and pain.  Gastrointestinal: Negative for nausea.  Skin:  Negative for rash.  Neurological: Negative for dizziness and headaches.    OBJECTIVE  His  height is 5\' 8"  (1.727 m) and weight is 246 lb 12.8 oz (111.948 kg). His oral temperature is 97 F (36.1 C). His blood pressure is 128/78 and his pulse is 68. His respiration is 16 and oxygen saturation is 97%.  The patient's body mass index is 37.53 kg/(m^2).  Physical Exam  Constitutional: He is oriented to person, place, and time. He appears well-developed and well-nourished. No distress.  Cardiovascular: Normal rate, regular rhythm and intact distal pulses.  Exam reveals no gallop and no friction rub.   No murmur heard. Respiratory: Effort normal and breath sounds normal.    GI: Soft. Bowel sounds are normal.  Neurological: He is alert and oriented to person, place, and time. No cranial nerve deficit.  Skin: Skin is warm and dry. No rash noted. He is not diaphoretic. No pallor.  Psychiatric: He has a normal mood and affect.    No results found for this or any previous visit (from the past 24 hour(s)).  ASSESSMENT & PLAN  [name] was seen today for follow-up.  Diagnoses and all orders for this visit:  Severe hypertension: Now controlled. Will check a CMPGFR.   Will continue current medication regimen.  Patient to follow up with myself or Dr. Brigitte Pulse in 6 months for this problem.    Abscess: Resolved.     The patient was advised to call or come back to clinic if he does not see an improvement in symptoms, or worsens with the above plan.   Philis Fendt, MHS, PA-C [facility] 11/12/2014 7:43 PM

## 2014-11-13 LAB — COMPLETE METABOLIC PANEL WITH GFR
ALT: 23 U/L (ref 9–46)
AST: 19 U/L (ref 10–40)
Albumin: 4 g/dL (ref 3.6–5.1)
Alkaline Phosphatase: 123 U/L — ABNORMAL HIGH (ref 40–115)
BUN: 14 mg/dL (ref 7–25)
CHLORIDE: 108 mmol/L (ref 98–110)
CO2: 25 mmol/L (ref 20–31)
CREATININE: 1.06 mg/dL (ref 0.60–1.35)
Calcium: 9.4 mg/dL (ref 8.6–10.3)
GFR, Est Non African American: 86 mL/min (ref >=60)
Glucose, Bld: 96 mg/dL (ref 65–99)
Potassium: 3.8 mmol/L (ref 3.5–5.3)
Sodium: 143 mmol/L (ref 135–146)
Total Bilirubin: 1.2 mg/dL (ref 0.2–1.2)
Total Protein: 7.6 g/dL (ref 6.1–8.1)

## 2014-11-15 ENCOUNTER — Encounter: Payer: Self-pay | Admitting: Family Medicine

## 2014-11-15 ENCOUNTER — Other Ambulatory Visit: Payer: Self-pay | Admitting: Physician Assistant

## 2014-11-15 DIAGNOSIS — R748 Abnormal levels of other serum enzymes: Secondary | ICD-10-CM

## 2014-11-16 ENCOUNTER — Encounter: Payer: Self-pay | Admitting: *Deleted

## 2014-11-16 DIAGNOSIS — C642 Malignant neoplasm of left kidney, except renal pelvis: Secondary | ICD-10-CM

## 2014-11-17 ENCOUNTER — Telehealth: Payer: Self-pay

## 2014-11-17 NOTE — Telephone Encounter (Signed)
Patient has been adequately treated.  He should give this more time.  Return to clinic if it becomes tender, or if he develops fever.  Philis Fendt, MS, PA-C   9:12 PM, 11/17/2014

## 2014-11-17 NOTE — Telephone Encounter (Signed)
Pt says area around abscess is still red. Abscess has decreased in size. Pt is experiencing a lot of itching around abscess. No fever.   Wants to know if he should RTC?

## 2014-11-17 NOTE — Telephone Encounter (Signed)
Patient states he is not completely cleared up with the antibiotics.  Does [name] want to see him again or call in more medication   [phone]

## 2014-11-26 NOTE — Telephone Encounter (Signed)
Called and left VM to make sure redness has decreased.

## 2014-12-03 ENCOUNTER — Other Ambulatory Visit: Payer: Self-pay | Admitting: Physician Assistant

## 2015-03-11 ENCOUNTER — Ambulatory Visit (INDEPENDENT_AMBULATORY_CARE_PROVIDER_SITE_OTHER): Payer: BLUE CROSS/BLUE SHIELD | Admitting: Family Medicine

## 2015-03-11 VITALS — BP 136/94 | HR 66 | Temp 97.7°F | Resp 18 | Ht 68.0 in | Wt 250.8 lb

## 2015-03-11 DIAGNOSIS — I10 Essential (primary) hypertension: Secondary | ICD-10-CM

## 2015-03-11 LAB — COMPLETE METABOLIC PANEL WITH GFR
ALT: 21 U/L (ref 9–46)
AST: 16 U/L (ref 10–40)
Albumin: 4 g/dL (ref 3.6–5.1)
Alkaline Phosphatase: 107 U/L (ref 40–115)
BUN: 13 mg/dL (ref 7–25)
CO2: 25 mmol/L (ref 20–31)
Calcium: 9.2 mg/dL (ref 8.6–10.3)
Chloride: 109 mmol/L (ref 98–110)
Creat: 0.87 mg/dL (ref 0.60–1.35)
GFR, Est African American: >89 mL/min (ref >=60)
GFR, Est Non African American: >89 mL/min (ref >=60)
Glucose, Bld: 113 mg/dL — ABNORMAL HIGH (ref 65–99)
Potassium: 3.6 mmol/L (ref 3.5–5.3)
Sodium: 142 mmol/L (ref 135–146)
Total Bilirubin: 0.4 mg/dL (ref 0.2–1.2)
Total Protein: 7.3 g/dL (ref 6.1–8.1)

## 2015-03-11 MED ORDER — METOPROLOL SUCCINATE ER 50 MG PO TB24: 50.0000 mg | ORAL_TABLET | Freq: Every day | ORAL | Status: DC

## 2015-03-11 NOTE — Patient Instructions (Signed)
Hypertension Hypertension, commonly called high blood pressure, is when the force of blood pumping through your arteries is too strong. Your arteries are the blood vessels that carry blood from your heart throughout your body. A blood pressure reading consists of a higher number over a lower number, such as 110/72. The higher number (systolic) is the pressure inside your arteries when your heart pumps. The lower number (diastolic) is the pressure inside your arteries when your heart relaxes. Ideally you want your blood pressure below 120/80. Hypertension forces your heart to work harder to pump blood. Your arteries may become narrow or stiff. Having untreated or uncontrolled hypertension can cause heart attack, stroke, kidney disease, and other problems. RISK FACTORS Some risk factors for high blood pressure are controllable. Others are not.  Risk factors you cannot control include:   Race. You may be at higher risk if you are African American.  Age. Risk increases with age.  Gender. Men are at higher risk than women before age 45 years. After age 65, women are at higher risk than men. Risk factors you can control include:  Not getting enough exercise or physical activity.  Being overweight.  Getting too much fat, sugar, calories, or salt in your diet.  Drinking too much alcohol. SIGNS AND SYMPTOMS Hypertension does not usually cause signs or symptoms. Extremely high blood pressure (hypertensive crisis) may cause headache, anxiety, shortness of breath, and nosebleed. DIAGNOSIS To check if you have hypertension, your health care provider will measure your blood pressure while you are seated, with your arm held at the level of your heart. It should be measured at least twice using the same arm. Certain conditions can cause a difference in blood pressure between your right and left arms. A blood pressure reading that is higher than normal on one occasion does not mean that you need treatment. If  it is not clear whether you have high blood pressure, you may be asked to return on a different day to have your blood pressure checked again. Or, you may be asked to monitor your blood pressure at home for 1 or more weeks. TREATMENT Treating high blood pressure includes making lifestyle changes and possibly taking medicine. Living a healthy lifestyle can help lower high blood pressure. You may need to change some of your habits. Lifestyle changes may include:  Following the DASH diet. This diet is high in fruits, vegetables, and whole grains. It is low in salt, red meat, and added sugars.  Keep your sodium intake below 2,300 mg per day.  Getting at least 30-45 minutes of aerobic exercise at least 4 times per week.  Losing weight if necessary.  Not smoking.  Limiting alcoholic beverages.  Learning ways to reduce stress. Your health care provider may prescribe medicine if lifestyle changes are not enough to get your blood pressure under control, and if one of the following is true:  You are 18-59 years of age and your systolic blood pressure is above 140.  You are 60 years of age or older, and your systolic blood pressure is above 150.  Your diastolic blood pressure is above 90.  You have diabetes, and your systolic blood pressure is over 140 or your diastolic blood pressure is over 90.  You have kidney disease and your blood pressure is above 140/90.  You have heart disease and your blood pressure is above 140/90. Your personal target blood pressure may vary depending on your medical conditions, your age, and other factors. HOME CARE INSTRUCTIONS    Have your blood pressure rechecked as directed by your health care provider.   Take medicines only as directed by your health care provider. Follow the directions carefully. Blood pressure medicines must be taken as prescribed. The medicine does not work as well when you skip doses. Skipping doses also puts you at risk for  problems.  Do not smoke.   Monitor your blood pressure at home as directed by your health care provider. SEEK MEDICAL CARE IF:   You think you are having a reaction to medicines taken.  You have recurrent headaches or feel dizzy.  You have swelling in your ankles.  You have trouble with your vision. SEEK IMMEDIATE MEDICAL CARE IF:  You develop a severe headache or confusion.  You have unusual weakness, numbness, or feel faint.  You have severe chest or abdominal pain.  You vomit repeatedly.  You have trouble breathing. MAKE SURE YOU:   Understand these instructions.  Will watch your condition.  Will get help right away if you are not doing well or get worse.   This information is not intended to replace advice given to you by your health care provider. Make sure you discuss any questions you have with your health care provider.   Document Released: 03/13/2005 Document Revised: 07/28/2014 Document Reviewed: 01/03/2013 Elsevier Interactive Patient Education 2016 Elsevier Inc.  

## 2015-03-11 NOTE — Progress Notes (Signed)
By signing my name below, I, Rawaa Al Rifaie, attest that this documentation has been prepared under the direction and in the presence of Robyn Haber, [place], Medical Scribe. 03/11/2015.  8:21 AM. Patient ID: [name] MRN: [id number], DOB: 05-20-1970, 44 y.o. Date of Encounter: 03/11/2015  Primary Physician: Delman Cheadle, MD  Chief Complaint:  Chief Complaint  Patient presents with  . blood pressure check    chcked BP today and was ranging in the 190/130  . other    stated he doubled up on his medication once he saw he was running high  . Dizziness  . Flu Vaccine    HPI:  [name] is a 44 y.o. male who presents to [facility] complaining of elevated blood pressure.  Pt states that he checked his blood pressure this morning and it was ranging in 190/130. Pt takes atenolol and lisinopril, and he indicates that he took 2 dosages from each medication to get his blood pressure down. Pt denies chest pain, or shortness of breath. He notes that he underwent partial nephrectomy secondary to a renal cell cancer, and since then, his blood pressure has been ranging at high levels. He states that the cancer was detected as he had a scanning done on the area secondary to a flu symptoms. He denies hematuria. Pt has an appointment on 12/21 for a CAT scan.   Pt is an Sales executive.      Past Medical History  Diagnosis Date  . Hypertension   . Pneumonia 04/2014  . History of TIA (transient ischemic attack) 04/2009    numbness left pinky finger tip  . Bipolar disorder ([place])   . GERD (gastroesophageal reflux disease)     occasional  . History of kidney stones     x1 3 years ago  . Headache     hx of, none recently  . Cancer [facility])     "most likely left renal cancer"     Home Meds: Prior to Admission medications   Medication Sig Start Date End Date Taking? Authorizing Provider  atenolol (TENORMIN) 25 MG tablet TAKE 1 TABLET (25 MG TOTAL) BY MOUTH DAILY.  12/03/14  Yes Tereasa Coop, PA-C  hydrOXYzine (ATARAX/VISTARIL) 25 MG tablet TAKE 1/2 TO 1  TABLETS (12.5 MG TO 25 MG TOTAL) BY MOUTH EVERY 8  HOURS AS NEEDED FOR ANXIETY. 11/25/12  Yes Shawnee Knapp, MD  lisinopril-hydrochlorothiazide (PRINZIDE,ZESTORETIC) 10-12.5 MG per tablet Take 1 tablet by mouth daily. 11/12/14  Yes Tereasa Coop, PA-C  Tetrahydrozoline HCl (VISINE OP) Apply 2 drops to eye as needed (redness).   Yes Historical Provider, MD  traZODone (DESYREL) 50 MG tablet Take 50 mg by mouth at bedtime as needed for sleep.   Yes Historical Provider, MD    Allergies:  Allergies  Allergen Reactions  . Codeine Other (See Comments)    dizzy  . Hydrocodone-Acetaminophen Other (See Comments)    dizzy    Social History   Social History  . Marital Status: Single    Spouse Name: N/A  . Number of Children: N/A  . Years of Education: N/A   Occupational History  . Not on file.   Social History Main Topics  . Smoking status: Never Smoker   . Smokeless tobacco: Never Used  . Alcohol Use: Yes     Comment: rare  . Drug Use: No  . Sexual Activity: Not on file   Other Topics Concern  .  Not on file   Social History Narrative     Review of Systems: Constitutional: negative for chills, fever, night sweats, weight changes, or fatigue  HEENT: negative for vision changes, hearing loss, congestion, rhinorrhea, ST, epistaxis, or sinus pressure Cardiovascular: negative for chest pain or palpitations Respiratory: negative for hemoptysis, wheezing, chest pain, shortness of breath, or cough Abdominal: negative for abdominal pain, nausea, vomiting, diarrhea, or constipation Dermatological: negative for rash Neurologic: negative for headache, dizziness, or syncope Negative for hematuria.  All other systems reviewed and are otherwise negative with the exception to those above and in the HPI.  Physical Exam: Blood pressure 136/94, pulse 66, temperature 97.7 F (36.5 C), temperature source  Oral, resp. rate 18, height 5\' 8"  (1.727 m), weight 250 lb 12.8 oz (113.762 kg), SpO2 97 %., Body mass index is 38.14 kg/(m^2). General: Well developed, well nourished, in no acute distress. Head: Normocephalic, atraumatic, eyes without discharge, sclera non-icteric, nares are without discharge. Bilateral auditory canals clear, TM's are without perforation, pearly grey and translucent with reflective cone of light bilaterally. Oral cavity moist, posterior pharynx without exudate, erythema, peritonsillar abscess, or post nasal drip.  Neck: Supple. No thyromegaly. Full ROM. No lymphadenopathy. Lungs: Clear bilaterally to auscultation without wheezes, rales, or rhonchi. Breathing is unlabored. Heart: RRR with S1 S2. No murmurs, rubs, or gallops appreciated. Abdomen: Soft, non-tender, surgical robotic scars Msk:  Strength and tone normal for age. Extremities/Skin: Warm and dry. No clubbing or cyanosis. No edema. No rashes or suspicious lesions. Neuro: Alert and oriented X 3. Moves all extremities spontaneously. Gait is normal. CNII-XII grossly in tact. Psych:  Responds to questions appropriately with a normal affect.    ASSESSMENT AND PLAN:  44 y.o. year old male with    1. Accelerated hypertension    This chart was scribed in my presence and reviewed by me personally.    ICD-9-CM ICD-10-CM   1. Accelerated hypertension 401.0 99991111 COMPLETE METABOLIC PANEL WITH GFR     metoprolol succinate (TOPROL-XL) 50 MG 24 hr tablet   Signed, Robyn Haber, MD 03/11/2015 8:22 AM

## 2015-03-19 ENCOUNTER — Ambulatory Visit (HOSPITAL_COMMUNITY)
Admission: RE | Admit: 2015-03-19 | Discharge: 2015-03-19 | Disposition: A | Discharge status: Home / Self Care | Payer: BLUE CROSS/BLUE SHIELD | Source: Ambulatory Visit | Attending: Urology | Admitting: Urology

## 2015-03-19 ENCOUNTER — Other Ambulatory Visit: Payer: Self-pay | Admitting: Urology

## 2015-03-19 DIAGNOSIS — Z85528 Personal history of other malignant neoplasm of kidney: Secondary | ICD-10-CM | POA: Insufficient documentation

## 2015-03-19 DIAGNOSIS — Z08 Encounter for follow-up examination after completed treatment for malignant neoplasm: Secondary | ICD-10-CM | POA: Insufficient documentation

## 2015-03-19 DIAGNOSIS — C642 Malignant neoplasm of left kidney, except renal pelvis: Secondary | ICD-10-CM

## 2015-04-10 ENCOUNTER — Telehealth: Payer: Self-pay | Admitting: Family Medicine

## 2015-04-10 NOTE — Telephone Encounter (Signed)
lmom to call and reschedule appt with Legrand Como

## 2015-05-20 ENCOUNTER — Ambulatory Visit: Payer: BLUE CROSS/BLUE SHIELD | Admitting: Physician Assistant

## 2015-05-26 ENCOUNTER — Ambulatory Visit: Payer: BLUE CROSS/BLUE SHIELD | Admitting: Physician Assistant

## 2015-06-28 ENCOUNTER — Ambulatory Visit (INDEPENDENT_AMBULATORY_CARE_PROVIDER_SITE_OTHER): Payer: BLUE CROSS/BLUE SHIELD | Admitting: Physician Assistant

## 2015-06-28 ENCOUNTER — Ambulatory Visit (INDEPENDENT_AMBULATORY_CARE_PROVIDER_SITE_OTHER): Payer: BLUE CROSS/BLUE SHIELD

## 2015-06-28 DIAGNOSIS — Z760 Encounter for issue of repeat prescription: Secondary | ICD-10-CM

## 2015-06-28 DIAGNOSIS — S060X0A Concussion without loss of consciousness, initial encounter: Secondary | ICD-10-CM

## 2015-06-28 DIAGNOSIS — M542 Cervicalgia: Secondary | ICD-10-CM | POA: Diagnosis not present

## 2015-06-28 MED ORDER — CYCLOBENZAPRINE HCL 10 MG PO TABS: 10.0000 mg | ORAL_TABLET | Freq: Three times a day (TID) | ORAL | Status: AC | PRN

## 2015-06-28 MED ORDER — TRAMADOL-ACETAMINOPHEN 37.5-325 MG PO TABS: 1.0000 | ORAL_TABLET | Freq: Four times a day (QID) | ORAL | Status: AC | PRN

## 2015-06-28 MED ORDER — METOPROLOL SUCCINATE ER 50 MG PO TB24: 50.0000 mg | ORAL_TABLET | Freq: Every day | ORAL | Status: AC

## 2015-06-28 MED ORDER — LISINOPRIL-HYDROCHLOROTHIAZIDE 10-12.5 MG PO TABS: 1.0000 | ORAL_TABLET | Freq: Every day | ORAL | Status: DC
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
## 2015-07-05 ENCOUNTER — Ambulatory Visit (INDEPENDENT_AMBULATORY_CARE_PROVIDER_SITE_OTHER): Payer: BLUE CROSS/BLUE SHIELD | Admitting: Family Medicine

## 2015-07-05 DIAGNOSIS — S060X0D Concussion without loss of consciousness, subsequent encounter: Secondary | ICD-10-CM | POA: Diagnosis not present

## 2015-07-05 DIAGNOSIS — M542 Cervicalgia: Secondary | ICD-10-CM

## 2015-07-05 DIAGNOSIS — M545 Low back pain, unspecified: Secondary | ICD-10-CM

## 2015-07-05 DIAGNOSIS — H9313 Tinnitus, bilateral: Secondary | ICD-10-CM
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
## 2015-07-05 NOTE — Patient Instructions (Addendum)
You can resume driving  The neck pain and low back pain should gradually resolve with time. You can continue to take the cyclobenzaprine muscle relaxant before going to sleep once daily.  In addition to this I recommend that you take Tylenol (acetaminophen) 2 pills 3 times daily as needed for pain and aching in the neck and back  Referral is being made to an ENT physician (ear physician specialist) to evaluate the persistent ringing in the ear  Plan to return again in about 7-10 days for one more follow-up  Postconcussion if you are having worse headaches or difficulty concentrating or dizziness or any other neurologic concerns please return.  If working gives you a recurrent problem with headache, then we may have to limit your work hours.  IF you received an x-ray today, you will receive an invoice from [facility]. Please contact [facility] at [phone] with questions or concerns regarding your invoice.   IF you received labwork today, you will receive an invoice from [employer]. Please contact [facility] at [phone] with questions or concerns regarding your invoice.   Our billing staff will not be able to assist you with questions regarding bills from these companies.  You will be contacted with the lab results as soon as they are available. The fastest way to get your results is to activate your My Chart account. Instructions are located on the last page of this paperwork. If you have not heard from [place] regarding the results in 2 weeks, please contact this office.

## 2015-07-14 ENCOUNTER — Ambulatory Visit (INDEPENDENT_AMBULATORY_CARE_PROVIDER_SITE_OTHER): Payer: BLUE CROSS/BLUE SHIELD | Admitting: Family Medicine

## 2015-07-14 ENCOUNTER — Telehealth: Payer: Self-pay

## 2015-07-14 VITALS — BP 168/110 | HR 68 | Temp 97.8°F | Resp 16 | Ht 68.0 in | Wt 244.6 lb

## 2015-07-14 DIAGNOSIS — Z136 Encounter for screening for cardiovascular disorders: Secondary | ICD-10-CM | POA: Diagnosis not present

## 2015-07-14 DIAGNOSIS — Z0000 Encounter for general adult medical examination without abnormal findings: Secondary | ICD-10-CM

## 2015-07-14 DIAGNOSIS — H9313 Tinnitus, bilateral: Secondary | ICD-10-CM | POA: Diagnosis not present

## 2015-07-14 DIAGNOSIS — Z23 Encounter for immunization: Secondary | ICD-10-CM

## 2015-07-14 DIAGNOSIS — Z760 Encounter for issue of repeat prescription: Secondary | ICD-10-CM | POA: Diagnosis not present

## 2015-07-14 DIAGNOSIS — Z1329 Encounter for screening for other suspected endocrine disorder: Secondary | ICD-10-CM

## 2015-07-14 DIAGNOSIS — Z1383 Encounter for screening for respiratory disorder NEC: Secondary | ICD-10-CM

## 2015-07-14 DIAGNOSIS — Z905 Acquired absence of kidney: Secondary | ICD-10-CM

## 2015-07-14 DIAGNOSIS — Z130 Encounter for screening for diseases of the blood and blood-forming organs and certain disorders involving the immune mechanism: Secondary | ICD-10-CM | POA: Diagnosis not present

## 2015-07-14 DIAGNOSIS — Z113 Encounter for screening for infections with a predominantly sexual mode of transmission: Secondary | ICD-10-CM | POA: Diagnosis not present

## 2015-07-14 DIAGNOSIS — Z1389 Encounter for screening for other disorder: Secondary | ICD-10-CM

## 2015-07-14 DIAGNOSIS — I10 Essential (primary) hypertension: Secondary | ICD-10-CM

## 2015-07-14 LAB — POC MICROSCOPIC URINALYSIS (UMFC)

## 2015-07-14 LAB — POCT URINALYSIS DIP (MANUAL ENTRY)
BILIRUBIN UA: NEGATIVE
BILIRUBIN UA: NEGATIVE
Blood, UA: NEGATIVE
Glucose, UA: NEGATIVE
LEUKOCYTES UA: NEGATIVE
NITRITE UA: NEGATIVE
PH UA: 6
PROTEIN UA: NEGATIVE
Spec Grav, UA: 1.02
Urobilinogen, UA: 0.2

## 2015-07-14 MED ORDER — LISINOPRIL-HYDROCHLOROTHIAZIDE 20-25 MG PO TABS: 1.0000 | ORAL_TABLET | Freq: Every day | ORAL | Status: AC

## 2015-07-14 NOTE — Telephone Encounter (Signed)
[facility] called about an order that needs to be changed.  The order for MRA HEAD W CONTRAST needs to be changed to WITHOUT contrast.  CB#: [id number]
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
## 2015-07-14 NOTE — Patient Instructions (Addendum)
Managing Your High Blood Pressure Blood pressure is a measurement of how forceful your blood is pressing against the walls of the arteries. Arteries are muscular tubes within the circulatory system. Blood pressure does not stay the same. Blood pressure rises when you are active, excited, or nervous; and it lowers during sleep and relaxation. If the numbers measuring your blood pressure stay above normal most of the time, you are at risk for health problems. High blood pressure (hypertension) is a long-term (chronic) condition in which blood pressure is elevated. A blood pressure reading is recorded as two numbers, such as 120 over 80 (or 120/80). The first, higher number is called the systolic pressure. It is a measure of the pressure in your arteries as the heart beats. The second, lower number is called the diastolic pressure. It is a measure of the pressure in your arteries as the heart relaxes between beats.  Keeping your blood pressure in a normal range is important to your overall health and prevention of health problems, such as heart disease and stroke. When your blood pressure is uncontrolled, your heart has to work harder than normal. High blood pressure is a very common condition in adults because blood pressure tends to rise with age. Men and women are equally likely to have hypertension but at different times in life. Before age 45, men are more likely to have hypertension. After 44 years of age, women are more likely to have it. Hypertension is especially common in African Americans. This condition often has no signs or symptoms. The cause of the condition is usually not known. Your caregiver can help you come up with a plan to keep your blood pressure in a normal, healthy range. BLOOD PRESSURE STAGES Blood pressure is classified into four stages: normal, prehypertension, stage 1, and stage 2. Your blood pressure reading will be used to determine what type of treatment, if any, is necessary.  Appropriate treatment options are tied to these four stages:  Normal  Systolic pressure (mm Hg): below 120.  Diastolic pressure (mm Hg): below 80. Prehypertension  Systolic pressure (mm Hg): 120 to 139.  Diastolic pressure (mm Hg): 80 to 89. Stage1  Systolic pressure (mm Hg): 140 to 159.  Diastolic pressure (mm Hg): 90 to 99. Stage2  Systolic pressure (mm Hg): 160 or above.  Diastolic pressure (mm Hg): 100 or above. RISKS RELATED TO HIGH BLOOD PRESSURE Managing your blood pressure is an important responsibility. Uncontrolled high blood pressure can lead to:  A heart attack.  A stroke.  A weakened blood vessel (aneurysm).  Heart failure.  Kidney damage.  Eye damage.  Metabolic syndrome.  Memory and concentration problems. HOW TO MANAGE YOUR BLOOD PRESSURE Blood pressure can be managed effectively with lifestyle changes and medicines (if needed). Your caregiver will help you come up with a plan to bring your blood pressure within a normal range. Your plan should include the following: Education  Read all information provided by your caregivers about how to control blood pressure.  Educate yourself on the latest guidelines and treatment recommendations. New research is always being done to further define the risks and treatments for high blood pressure. Lifestylechanges  Control your weight.  Avoid smoking.  Stay physically active.  Reduce the amount of salt in your diet.  Reduce stress.  Control any chronic conditions, such as high cholesterol or diabetes.  Reduce your alcohol intake. Medicines  Several medicines (antihypertensive medicines) are available, if needed, to bring blood pressure within a normal range.  Communication  Review all the medicines you take with your caregiver because there may be side effects or interactions.  Talk with your caregiver about your diet, exercise habits, and other lifestyle factors that may be contributing to  high blood pressure.  See your caregiver regularly. Your caregiver can help you create and adjust your plan for managing high blood pressure. RECOMMENDATIONS FOR TREATMENT AND FOLLOW-UP  The following recommendations are based on current guidelines for managing high blood pressure in nonpregnant adults. Use these recommendations to identify the proper follow-up period or treatment option based on your blood pressure reading. You can discuss these options with your caregiver.  Systolic pressure of 123456 to XX123456 or diastolic pressure of 80 to 89: Follow up with your caregiver as directed.  Systolic pressure of XX123456 to 0000000 or diastolic pressure of 90 to 100: Follow up with your caregiver within 2 months.  Systolic pressure above 0000000 or diastolic pressure above 123XX123: Follow up with your caregiver within 1 month.  Systolic pressure above 99991111 or diastolic pressure above A999333: Consider antihypertensive therapy; follow up with your caregiver within 1 week.  Systolic pressure above A999333 or diastolic pressure above 123456: Begin antihypertensive therapy; follow up with your caregiver within 1 week.   This information is not intended to replace advice given to you by your health care provider. Make sure you discuss any questions you have with your health care provider.   Document Released: 12/06/2011 Document Reviewed: 12/06/2011 Elsevier Interactive Patient Education [employer].     IF you received an x-ray today, you will receive an invoice from [facility]. Please contact [facility] at [phone] with questions or concerns regarding your invoice.   IF you received labwork today, you will receive an invoice from [employer]. Please contact [facility] at [phone] with questions or concerns regarding your invoice.   Our billing staff will not be able to assist you with questions regarding bills from these companies.  You will be contacted with the lab  results as soon as they are available. The fastest way to get your results is to activate your My Chart account. Instructions are located on the last page of this paperwork. If you have not heard from [place] regarding the results in 2 weeks, please contact this office.   Keeping you healthy  Get these tests  Blood pressure- Have your blood pressure checked once a year by your healthcare provider.  Normal blood pressure is 120/80.  Weight- Have your body mass index (BMI) calculated to screen for obesity.  BMI is a measure of body fat based on height and weight. You can also calculate your own BMI at [employer].it.  Cholesterol- Have your cholesterol checked regularly starting at age 86, sooner may be necessary if you have diabetes, high blood pressure, if a family member developed heart diseases at an early age or if you smoke.   Chlamydia, HIV, and other sexual transmitted disease- Get screened each year until the age of 99 then within three months of each new sexual partner.  Diabetes- Have your blood sugar checked regularly if you have high blood pressure, high cholesterol, a family history of diabetes or if you are overweight.  Get these vaccines  Flu shot- Every fall.  Tetanus shot- Every 10 years.  Menactra- Single dose; prevents meningitis.  Take these steps  Don't smoke- If you do smoke, ask your healthcare provider about quitting. For tips on how to quit, go to www.smokefree.gov or call 1-800-QUIT-NOW.  Be physically active- Exercise 5 days a week for at least 30 minutes.  If you are not already physically active start slow and gradually work up to 30 minutes of moderate physical activity.  Examples of moderate activity include walking briskly, mowing the yard, dancing, swimming bicycling, etc.  Eat a healthy diet- Eat a variety of healthy foods such as fruits, vegetables, low fat milk, low fat cheese, yogurt, lean meats, poultry, fish, beans, tofu, etc.  For more  information on healthy eating, go to www.thenutritionsource.org  Drink alcohol in moderation- Limit alcohol intake two drinks or less a day.  Never drink and drive.  Dentist- Brush and floss teeth twice daily; visit your dentis twice a year.  Depression-Your emotional health is as important as your physical health.  If you're feeling down, losing interest in things you normally enjoy please talk with your healthcare provider.  Gun Safety- If you keep a gun in your home, keep it unloaded and with the safety lock on.  Bullets should be stored separately.  Helmet use- Always wear a helmet when riding a motorcycle, bicycle, rollerblading or skateboarding.  Safe sex- If you may be exposed to a sexually transmitted infection, use a condom  Seat belts- Seat bels can save your life; always wear one.  Smoke/Carbon Monoxide detectors- These detectors need to be installed on the appropriate level of your home.  Replace batteries at least once a year.  Skin Cancer- When out in the sun, cover up and use sunscreen SPF 15 or higher.  Violence- If anyone is threatening or hurting you, please tell your healthcare provider.

## 2015-07-15 ENCOUNTER — Other Ambulatory Visit (INDEPENDENT_AMBULATORY_CARE_PROVIDER_SITE_OTHER): Payer: BLUE CROSS/BLUE SHIELD

## 2015-07-15 DIAGNOSIS — Z1383 Encounter for screening for respiratory disorder NEC: Secondary | ICD-10-CM

## 2015-07-15 DIAGNOSIS — Z905 Acquired absence of kidney: Secondary | ICD-10-CM

## 2015-07-15 DIAGNOSIS — Z1389 Encounter for screening for other disorder: Secondary | ICD-10-CM

## 2015-07-15 DIAGNOSIS — Z136 Encounter for screening for cardiovascular disorders: Secondary | ICD-10-CM

## 2015-07-15 DIAGNOSIS — I10 Essential (primary) hypertension: Secondary | ICD-10-CM

## 2015-07-15 DIAGNOSIS — Z0000 Encounter for general adult medical examination without abnormal findings: Secondary | ICD-10-CM

## 2015-07-15 DIAGNOSIS — Z1329 Encounter for screening for other suspected endocrine disorder: Secondary | ICD-10-CM

## 2015-07-15 DIAGNOSIS — Z130 Encounter for screening for diseases of the blood and blood-forming organs and certain disorders involving the immune mechanism: Secondary | ICD-10-CM

## 2015-07-15 LAB — COMPREHENSIVE METABOLIC PANEL
ALBUMIN: 4 g/dL (ref 3.6–5.1)
ALK PHOS: 111 U/L (ref 40–115)
ALT: 26 U/L (ref 9–46)
AST: 21 U/L (ref 10–40)
BUN: 13 mg/dL (ref 7–25)
CALCIUM: 8.9 mg/dL (ref 8.6–10.3)
CHLORIDE: 104 mmol/L (ref 98–110)
CO2: 26 mmol/L (ref 20–31)
Creat: 0.83 mg/dL (ref 0.60–1.35)
Glucose, Bld: 90 mg/dL (ref 65–99)
POTASSIUM: 4.2 mmol/L (ref 3.5–5.3)
Sodium: 140 mmol/L (ref 135–146)
Total Bilirubin: 0.9 mg/dL (ref 0.2–1.2)
Total Protein: 7.4 g/dL (ref 6.1–8.1)

## 2015-07-15 LAB — LIPID PANEL
CHOL/HDL RATIO: 4.6 ratio (ref <=5.0)
CHOLESTEROL: 144 mg/dL (ref 125–200)
HDL: 31 mg/dL — ABNORMAL LOW (ref >=40)
LDL Cholesterol: 92 mg/dL (ref <130)
TRIGLYCERIDES: 107 mg/dL (ref <150)
VLDL: 21 mg/dL (ref <30)

## 2015-07-15 LAB — CBC
HCT: 47.1 % (ref 38.5–50.0)
Hemoglobin: 16.6 g/dL (ref 13.2–17.1)
MCH: 32.2 pg (ref 27.0–33.0)
MCHC: 35.2 g/dL (ref 32.0–36.0)
MCV: 91.3 fL (ref 80.0–100.0)
MPV: 9.9 fL (ref 7.5–12.5)
Platelets: 250 10*3/uL (ref 140–400)
RBC: 5.16 MIL/uL (ref 4.20–5.80)
RDW: 13.5 % (ref 11.0–15.0)
WBC: 8.8 10*3/uL (ref 3.8–10.8)

## 2015-07-15 LAB — MICROALBUMIN / CREATININE URINE RATIO
Creatinine, Urine: 234 mg/dL (ref 20–370)
Microalb Creat Ratio: 7 mcg/mg creat (ref <30)
Microalb, Ur: 1.7 mg/dL

## 2015-07-15 LAB — TSH: TSH: 0.53 mIU/L (ref 0.40–4.50)

## 2015-07-15 NOTE — Telephone Encounter (Signed)
Done

## 2015-07-19 ENCOUNTER — Encounter: Payer: Self-pay | Admitting: Family Medicine

## 2015-07-20 NOTE — Addendum Note (Signed)
Addended by: Mancel Bale on: 07/20/2015 09:23 AM   Modules accepted: Orders

## 2015-07-23 ENCOUNTER — Ambulatory Visit
Admission: RE | Admit: 2015-07-23 | Discharge: 2015-07-23 | Disposition: A | Discharge status: Home / Self Care | Payer: BLUE CROSS/BLUE SHIELD | Source: Ambulatory Visit | Attending: Physician Assistant | Admitting: Physician Assistant

## 2015-07-23 ENCOUNTER — Inpatient Hospital Stay: Admission: RE | Admit: 2015-07-23 | Payer: BLUE CROSS/BLUE SHIELD | Source: Ambulatory Visit

## 2015-07-23 DIAGNOSIS — H9313 Tinnitus, bilateral: Secondary | ICD-10-CM

## 2015-08-03 ENCOUNTER — Telehealth: Payer: Self-pay | Admitting: *Deleted

## 2015-08-03 NOTE — Telephone Encounter (Signed)
I don't know what precisely they would do but neurology may be better able to assess whether the tinnitus is due to post-concussive syndrome and thereby might be able to offer more info on prognosis, suggest ways to prevent worsening, and see if there are any medications that might help.  Very sorry to hear that has had not had improvement.

## 2015-08-03 NOTE — Telephone Encounter (Signed)
Advised pt of results.  He states that the appt with ENT did not go well, because they said they could not do any thing for the ringing and he still having ringing.  He would like to know what would the neurologist do?

## 2015-08-03 NOTE — Telephone Encounter (Signed)
-----   Message from Shawnee Knapp, MD sent at 08/02/2015 12:07 PM EDT ----- Please let [name] know that the MRA of his brain did not show a cause for the ringing/sound in his ears.  In some of the main arteries that gets blood to the brain, the arteries are "moderately" narrowed from cholesterol plaques which does place him at an increased risk of stroke as he already has this at such a relatively young age.  Keeping his blood pressure and cholesterol VERY VERY well controlled - and likely getting his cholesterol to a lower number than had been our goal prior - is going to be very important in helping to prevent this from rapidly worsening.  He has already made some lifestyle changes that are going to help with this a ton - he just needs to keep them up.  If he has any further concerns, I would be happy to refer him to neurology.  How did the ENT appt go?

## 2015-08-04 NOTE — Telephone Encounter (Signed)
Pt advised. He asked to make an appointment with Dr. Brigitte Pulse to discuss. Transferred to scheduling.

## 2015-08-05 ENCOUNTER — Telehealth: Payer: Self-pay

## 2015-08-05 NOTE — Telephone Encounter (Signed)
-----   Message from Shawnee Knapp, MD sent at 08/02/2015 12:07 PM EDT ----- Please let pt know that the MRA of his brain did not show a cause for the ringing/sound in his ears.  In some of the main arteries that gets blood to the brain, the arteries are "moderately" narrowed from cholesterol plaques which does place him at an increased risk of stroke as he already has this at such a relatively young age.  Keeping his blood pressure and cholesterol VERY VERY well controlled - and likely getting his cholesterol to a lower number than had been our goal prior - is going to be very important in helping to prevent this from rapidly worsening.  He has already made some lifestyle changes that are going to help with this a ton - he just needs to keep them up.  If he has any further concerns, I would be happy to refer him to neurology.  How did the ENT appt go?

## 2015-08-05 NOTE — Telephone Encounter (Signed)
Spoke with [name].  He states he had a call yesterday going over the results.   Answered other questions about his cholesterol.   He preferrs not having a neuro appointment until he speaks with Dr. Brigitte Pulse.  Went over exercise and diet changes will continue to help him.    He states ENT told  Him to use a white noise machine to mask the ringing/sound.  He appreciated the call

## 2015-08-06 ENCOUNTER — Ambulatory Visit (INDEPENDENT_AMBULATORY_CARE_PROVIDER_SITE_OTHER): Payer: BLUE CROSS/BLUE SHIELD | Admitting: Physician Assistant

## 2015-08-06 ENCOUNTER — Telehealth: Payer: Self-pay | Admitting: Physician Assistant

## 2015-08-06 VITALS — BP 122/80 | HR 77 | Temp 99.1°F | Resp 16 | Ht 68.0 in | Wt 238.6 lb

## 2015-08-06 DIAGNOSIS — Z418 Encounter for other procedures for purposes other than remedying health state: Secondary | ICD-10-CM | POA: Diagnosis not present

## 2015-08-06 DIAGNOSIS — Z7251 High risk heterosexual behavior: Secondary | ICD-10-CM

## 2015-08-06 DIAGNOSIS — Z299 Encounter for prophylactic measures, unspecified: Secondary | ICD-10-CM

## 2015-08-06 LAB — COMPLETE METABOLIC PANEL WITH GFR
ALT: 29 U/L (ref 9–46)
AST: 24 U/L (ref 10–40)
Albumin: 4.3 g/dL (ref 3.6–5.1)
Alkaline Phosphatase: 109 U/L (ref 40–115)
BUN: 23 mg/dL (ref 7–25)
CHLORIDE: 105 mmol/L (ref 98–110)
CO2: 22 mmol/L (ref 20–31)
Calcium: 9.2 mg/dL (ref 8.6–10.3)
Creat: 1.03 mg/dL (ref 0.60–1.35)
GFR, EST NON AFRICAN AMERICAN: 88 mL/min (ref >=60)
GFR, Est African American: >89 mL/min (ref >=60)
GLUCOSE: 94 mg/dL (ref 65–99)
POTASSIUM: 4.1 mmol/L (ref 3.5–5.3)
SODIUM: 139 mmol/L (ref 135–146)
Total Bilirubin: 0.6 mg/dL (ref 0.2–1.2)
Total Protein: 8.1 g/dL (ref 6.1–8.1)

## 2015-08-06 LAB — HIV ANTIBODY (ROUTINE TESTING W REFLEX): HIV 1&2 Ab, 4th Generation: NONREACTIVE

## 2015-08-06 MED ORDER — EMTRICITABINE-TENOFOVIR AF 200-25 MG PO TABS: 1.0000 | ORAL_TABLET | Freq: Every day | ORAL | Status: DC

## 2015-08-06 MED ORDER — DOLUTEGRAVIR SODIUM 50 MG PO TABS: 50.0000 mg | ORAL_TABLET | Freq: Every day | ORAL | Status: DC

## 2015-08-06 MED ORDER — EMTRICITABINE-TENOFOVIR AF 200-25 MG PO TABS
1.0000 | ORAL_TABLET | Freq: Every day | ORAL | Status: DC
Start: 2015-08-06 — End: 2015-08-17

## 2015-08-06 NOTE — Progress Notes (Signed)
08/06/2015 1:50 PM   DOB: [birth year] / MRN: [id number]  SUBJECTIVE:  [name] is a 45 y.o. male presenting for for PEP.  He is a MSM who reports he had receptive anal sex with a stranger last night and is not sure he can trust this person.  They met on the internet a few weeks ago and this was their first sexual encounter.   He is allergic to codeine and hydrocodone-acetaminophen.   He  has a past medical history of Hypertension; Pneumonia (04/2014); History of TIA (transient ischemic attack) (04/2009); Bipolar disorder ([place]); GERD (gastroesophageal reflux disease); History of kidney stones; Headache; and Cancer ([place]).    He  reports that he has never smoked. He has never used smokeless tobacco. He reports that he drinks alcohol. He reports that he does not use illicit drugs. He  has no sexual activity history on file. 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  His family history includes Diabetes in his father and mother; Hypertension in his father, mother, and sister.  Review of Systems  Psychiatric/Behavioral: The patient is nervous/anxious.     Problem list and medications reviewed and updated by myself where necessary, and exist elsewhere in the encounter.   OBJECTIVE:  BP 122/80 mmHg  Pulse 77  Temp(Src) 99.1 F (37.3 C) (Oral)  Resp 16  Ht _0  (1.727 m)  Wt 238 lb 9.6 oz (108.228 kg)  BMI 36.29 kg/m2  SpO2 96%  Physical Exam  Constitutional: He is oriented to person, place, and time.  Cardiovascular: Normal rate and regular rhythm.   Pulmonary/Chest: Effort normal and breath sounds normal.  Musculoskeletal: Normal range of motion.  Neurological: He is alert and oriented to person, place, and time.  Skin: Skin is warm and dry.    No results found for this or any previous visit (from the past 72 hour(s)).  No results found.  ASSESSMENT AND PLAN  [name] was seen today for  discuss sti.  Diagnoses and all orders for this visit:  Unprotected sexual intercourse: I have spoken to Dr. Johnnye Sima in [place] who agreed with [place] and [place].  Will add on Hep B titers per his recs. Will provide six weeks of therapy.  -     HIV antibody -     RPR -     COMPLETE METABOLIC PANEL WITH GFR -     GC/Chlamydia Probe Amp -     Hepatitis B surface antibody -     Hepatitis B surface antigen  Need for prophylactic measure -     emtricitabine-tenofovir AF (DESCOVY) 200-25 MG tablet; Take 1 tablet by mouth daily. -     dolutegravir (TIVICAY) 50 MG tablet; Take 1 tablet (50 mg total) by mouth daily.  Other orders -     Discontinue: emtricitabine-tenofovir AF (DESCOVY) 200-25 MG tablet; Take 1 tablet by mouth daily. -     Discontinue: dolutegravir (TIVICAY) 50 MG tablet; Take 1 tablet (50 mg total) by mouth daily. -     Discontinue: dolutegravir (TIVICAY) 50 MG tablet; Take 1 tablet (50 mg total) by mouth daily.    The patient was advised to call or return to clinic if he does not see an improvement in symptoms or to seek the care of the closest emergency department if he worsens with the above plan.   Philis Fendt, MHS, PA-C [facility] 08/06/2015 1:50  PM   Addendum: Patient here yelling and screaming at staff because he has insurance difficulties.  Reported earlier to me during the encounter that he did not care what the cost was. It is unclear why he can not purchase the medication outright at the pharmacy.  Bernestine Amass tried to resolve this issue and reports to me that [facility] did in fact have the medication on hand. Advised that if he yells at staff again then he will be dismissed from the practice.  Philis Fendt, MS, PA-C 5:42 PM, 08/06/2015

## 2015-08-06 NOTE — Patient Instructions (Signed)
     IF you received an x-ray today, you will receive an invoice from [facility]. Please contact [facility] at [phone] with questions or concerns regarding your invoice.   IF you received labwork today, you will receive an invoice from [facility]. Please contact [facility] at [phone] with questions or concerns regarding your invoice.   Our billing staff will not be able to assist you with questions regarding bills from these companies.  You will be contacted with the lab results as soon as they are available. The fastest way to get your results is to activate your My Chart account. Instructions are located on the last page of this paperwork. If you have not heard from us regarding the results in 2 weeks, please contact this office.      

## 2015-08-06 NOTE — Telephone Encounter (Signed)
Patient called SCREAMING at me b/c we had not called his medication into the [facility] -[phone].  He Screamed these have to be done in 48 hours.  Looking at the scripts they were sent to [facility].  He demanded that Philis Fendt call these in NOW.

## 2015-08-06 NOTE — Telephone Encounter (Signed)
Pt presents to clinic with paper Rx for the medications he was given today at his visit.  He wants them called to the [facility] special[facility] pharmacy.  He was rude to the front staff using F@#@ multiple times -  I called and spoke with a pharmacy representative from his insurance company and she states that he can [facility] that [facility] - she states that he did not have to go to [facility].  I [facility] a [facility] local [facility] to find this medication for the patient and [facility] [address] have both medications.  I explained this to patient - he did not appear grateful and rolled his eyes and said he would go and try - he did not seemed convinced that I had talked to his insurance company.  He was rude.

## 2015-08-07 LAB — RPR

## 2015-08-07 LAB — GC/CHLAMYDIA PROBE AMP
CT PROBE, AMP APTIMA: NOT DETECTED
GC Probe RNA: NOT DETECTED

## 2015-08-07 LAB — HEPATITIS B SURFACE ANTIGEN: Hepatitis B Surface Ag: NEGATIVE

## 2015-08-07 LAB — HEPATITIS B SURFACE ANTIBODY, QUANTITATIVE: Hepatitis B-Post: 0 m[IU]/mL

## 2015-08-10 NOTE — Telephone Encounter (Signed)
Left message prescriptions are at [facility].

## 2015-08-17 ENCOUNTER — Other Ambulatory Visit: Payer: Self-pay | Admitting: Physician Assistant

## 2015-08-17 MED ORDER — DOLUTEGRAVIR SODIUM 50 MG PO TABS: 50.0000 mg | ORAL_TABLET | Freq: Every day | ORAL | Status: AC

## 2015-08-17 MED ORDER — EMTRICITABINE-TENOFOVIR AF 200-25 MG PO TABS: 1.0000 | ORAL_TABLET | Freq: Every day | ORAL | Status: AC

## 2015-08-18 ENCOUNTER — Telehealth: Payer: Self-pay

## 2015-08-18 NOTE — Telephone Encounter (Signed)
[name] called from [name] concerning emtricitabine-tenofovir AF (DESCOVY) 200-25 MG tablet [id number]. These tablets come 30 to a bottle. We have sent a script for 15 tablets to be filled, they can not break a bottle for 15 tablets. Please advise at [phone]

## 2015-08-18 NOTE — Telephone Encounter (Signed)
[name], I see that it looks like pt got this med (according to 5/12 message) at [facility] [address] on 5/12. I do not see another req for it, but see your order from yesterday. Is it OK to give pt #30, and do you know if this is actually needed, or part of the original Rx from 5/12 that he ended up getting locally?

## 2015-08-20 NOTE — Telephone Encounter (Signed)
Hi Barabara,  This should have been sent to a specialty pharmacy.  Please check into this and make sure he has what he needs.    Philis Fendt, MS, PA-C 8:12 AM, 08/20/2015

## 2015-08-20 NOTE — Telephone Encounter (Signed)
[facility] [phone] and spoke to the pharmacist. I gave VO to increase the quantity to #30 if that is how it needs to be dispensed so that the pt can get his medication.

## 2015-08-26 ENCOUNTER — Other Ambulatory Visit: Payer: Self-pay

## 2015-08-26 ENCOUNTER — Telehealth: Payer: Self-pay

## 2015-08-26 DIAGNOSIS — Z139 Encounter for screening, unspecified: Secondary | ICD-10-CM

## 2015-08-26 NOTE — Telephone Encounter (Signed)
Error

## 2016-03-16 ENCOUNTER — Ambulatory Visit (INDEPENDENT_AMBULATORY_CARE_PROVIDER_SITE_OTHER): Payer: BLUE CROSS/BLUE SHIELD | Admitting: Physician Assistant

## 2016-03-16 VITALS — BP 112/76 | HR 86 | Temp 97.9°F | Resp 16 | Ht 68.0 in | Wt 247.0 lb

## 2016-03-16 DIAGNOSIS — J209 Acute bronchitis, unspecified: Secondary | ICD-10-CM

## 2016-03-16 DIAGNOSIS — Z23 Encounter for immunization: Secondary | ICD-10-CM | POA: Diagnosis not present

## 2016-03-16 MED ORDER — AZITHROMYCIN 250 MG PO TABS: ORAL_TABLET | ORAL | 0 refills | Status: AC

## 2016-03-16 MED ORDER — GUAIFENESIN ER 1200 MG PO TB12: 1.0000 | ORAL_TABLET | Freq: Two times a day (BID) | ORAL | 1 refills | Status: AC | PRN

## 2016-03-16 MED ORDER — FLUTICASONE PROPIONATE 50 MCG/ACT NA SUSP: 2.0000 | Freq: Every day | NASAL | 0 refills | Status: AC

## 2016-03-16 NOTE — Patient Instructions (Addendum)
Continue to hydrate well with 64 oz of water or more Please take antibiotic as prescribed, and use the nasal spray and mucinex as prescribed.  Acute Bronchitis, Adult Acute bronchitis is when air tubes (bronchi) in the lungs suddenly get swollen. The condition can make it hard to breathe. It can also cause these symptoms:  A cough.  Coughing up clear, yellow, or green mucus.  Wheezing.  Chest congestion.  Shortness of breath.  A fever.  Body aches.  Chills.  A sore throat. Follow these instructions at home: Medicines  Take over-the-counter and prescription medicines only as told by your doctor.  If you were prescribed an antibiotic medicine, take it as told by your doctor. Do not stop taking the antibiotic even if you start to feel better. General instructions  Rest.  Drink enough fluids to keep your pee (urine) clear or pale yellow.  Avoid smoking and secondhand smoke. If you smoke and you need help quitting, ask your doctor. Quitting will help your lungs heal faster.  Use an inhaler, cool mist vaporizer, or humidifier as told by your doctor.  Keep all follow-up visits as told by your doctor. This is important. How is this prevented? To lower your risk of getting this condition again:  Wash your hands often with soap and water. If you cannot use soap and water, use hand sanitizer.  Avoid contact with people who have cold symptoms.  Try not to touch your hands to your mouth, nose, or eyes.  Make sure to get the flu shot every year. Contact a doctor if:  Your symptoms do not get better in 2 weeks. Get help right away if:  You cough up blood.  You have chest pain.  You have very bad shortness of breath.  You become dehydrated.  You faint (pass out) or keep feeling like you are going to pass out.  You keep throwing up (vomiting).  You have a very bad headache.  Your fever or chills gets worse. This information is not intended to replace advice given  to you by your health care provider. Make sure you discuss any questions you have with your health care provider. Document Released: 08/30/2007 Document Revised: 10/20/2015 Document Reviewed: 09/01/2015 Elsevier Interactive Patient Education  2017 [employer].     IF you received an x-ray today, you will receive an invoice from [facility]. Please contact [facility] at [phone] with questions or concerns regarding your invoice.   IF you received labwork today, you will receive an invoice from [facility]. Please contact [facility] at [phone] with questions or concerns regarding your invoice.   Our billing staff will not be able to assist you with questions regarding bills from these companies.  You will be contacted with the lab results as soon as they are available. The fastest way to get your results is to activate your My Chart account. Instructions are located on the last page of this paperwork. If you have not heard from [place] regarding the results in 2 weeks, please contact this office.

## 2016-03-26 NOTE — Progress Notes (Signed)
[facility] [address] [place] [address] [phone]  Date:  03/16/2016   Name:  [name]   DOB:  13-Nov-1970   MRN:  [id number]  PCP:  Delman Cheadle, MD    History of Present Illness:  [name] is a 45 y.o. male patient who presents to [facility] for cc of cough for 1 month. Patient reports 1 month of a coughing,  no shortness of breath or trouble breathing. Patient reports some nasal congestion. No ear pain. Minimal sore throat secondary to coughing.   Patient Active Problem List   Diagnosis Date Noted  . Renal cell carcinoma of left kidney ([place]) 09/23/2014  . Renal mass 09/02/2014  . Acute renal failure syndrome ([place])   . Dehydration   . Left kidney mass   . CAP (community acquired pneumonia) 05/15/2014  . Nausea vomiting and diarrhea 05/15/2014  . Mass of left kidney 05/15/2014  . Transaminitis 05/15/2014  . Acute renal insufficiency 05/15/2014  . Hypertension 08/26/2012    Past Medical History:  Diagnosis Date  . Bipolar disorder ([place])   . Cancer [facility])    "most likely left renal cancer"  . GERD (gastroesophageal reflux disease)    occasional  . Headache    hx of, none recently  . History of kidney stones    x1 3 years ago  . History of TIA (transient ischemic attack) 04/2009   numbness left pinky finger tip  . Hypertension   . Pneumonia 04/2014    Past Surgical History:  Procedure Laterality Date  . NO PAST SURGERIES    . ROBOTIC ASSITED PARTIAL NEPHRECTOMY Left 09/02/2014   Procedure: ROBOTIC ASSITED PARTIAL NEPHRECTOMY, intra-op ultrasound;  Surgeon: Alexis Frock, MD;  Location: [facility];  Service: Urology;  Laterality: Left;  kidney    Social History  Substance Use Topics  . Smoking status: Never Smoker  . Smokeless tobacco: Never Used  . Alcohol use Yes     Comment: rare    Family History  Problem Relation Age of Onset  . Diabetes Mother   . Hypertension Mother   . Diabetes Father   . Hypertension Father   . Hypertension  Sister     Allergies  Allergen Reactions  . Codeine Other (See Comments)    dizzy  . Hydrocodone-Acetaminophen Other (See Comments)    dizzy    Medication list has been reviewed and updated.  Current Outpatient Prescriptions on File Prior to Visit  Medication Sig Dispense Refill  . lisinopril-hydrochlorothiazide (PRINZIDE,ZESTORETIC) 20-25 MG tablet Take 1 tablet by mouth daily. 30 tablet 1  . metoprolol succinate (TOPROL-XL) 50 MG 24 hr tablet Take 1 tablet (50 mg total) by mouth daily. Take with or immediately following a meal. 90 tablet 3  . cyclobenzaprine (FLEXERIL) 10 MG tablet Take 1 tablet (10 mg total) by mouth 3 (three) times daily as needed for muscle spasms. (Patient not taking: Reported on 03/16/2016) 12 tablet 0  . dolutegravir (TIVICAY) 50 MG tablet Take 1 tablet (50 mg total) by mouth daily. 15 tablet 0  . emtricitabine-tenofovir AF (DESCOVY) 200-25 MG tablet Take 1 tablet by mouth daily. (Patient not taking: Reported on 03/16/2016) 15 tablet 0  . Tetrahydrozoline HCl (VISINE OP) Apply 2 drops to eye as needed (redness).    . traMADol-acetaminophen (ULTRACET) 37.5-325 MG tablet Take 1-2 tablets by mouth every 6 (six) hours as needed. (Patient not taking: Reported on 03/16/2016) 30 tablet 0  . traZODone (DESYREL) 50 MG tablet Take  50 mg by mouth at bedtime as needed for sleep. Reported on 07/05/2015     No current facility-administered medications on file prior to visit.     ROS ROS otherwise unremarkable unless listed above.  Physical Examination: BP 112/76 (BP Location: Right Arm, Patient Position: Sitting, Cuff Size: Large)   Pulse 86   Temp 97.9 F (36.6 C)   Resp 16   Ht 5\' 8"  (1.727 m)   Wt 247 lb (112 kg)   SpO2 96%   BMI 37.56 kg/m  Ideal Body Weight: Weight in (lb) to have BMI = 25: 164.1  Physical Exam  Constitutional: He is oriented to person, place, and time. He appears well-developed and well-nourished. No distress.  HENT:  Head: Atraumatic.   Right Ear: Tympanic membrane, external ear and ear canal normal.  Left Ear: Tympanic membrane, external ear and ear canal normal.  Nose: Mucosal edema and rhinorrhea present. Right sinus exhibits no maxillary sinus tenderness and no frontal sinus tenderness. Left sinus exhibits no maxillary sinus tenderness and no frontal sinus tenderness.  Mouth/Throat: No uvula swelling. No oropharyngeal exudate, posterior oropharyngeal edema or posterior oropharyngeal erythema.  Eyes: Conjunctivae, EOM and lids are normal. Pupils are equal, round, and reactive to light. Right eye exhibits normal extraocular motion. Left eye exhibits normal extraocular motion.  Neck: Trachea normal and full passive range of motion without pain. No edema and no erythema present.  Cardiovascular: Normal rate.   Pulmonary/Chest: Effort normal. No respiratory distress. He has no decreased breath sounds. He has no wheezes. He has no rhonchi.  Neurological: He is alert and oriented to person, place, and time.  Skin: Skin is warm and dry. He is not diaphoretic.  Psychiatric: He has a normal mood and affect. His behavior is normal.     Assessment and Plan: [name] is a 45 y.o. male who is here today for cough. We will treat for possible bacterial etiology. Also advised Flonase to work with postnasal drip. We'll also do Mucinex. We will cancel the flu vaccine at this time. Advised for him to return when he feels completely better. Acute bronchitis, unspecified organism - Plan: Guaifenesin (MUCINEX MAXIMUM STRENGTH) 1200 MG TB12, fluticasone (FLONASE) 50 MCG/ACT nasal spray, azithromycin (ZITHROMAX) 250 MG tablet  Need for influenza vaccination - Plan: CANCELED: Flu Vaccine QUAD 36+ mos IM  Ivar Drape, PA-C [facility] 12/31/20179:14 AM     Ivar Drape, PA-C [facility] 03/26/2016 9:12 AM

## 2016-07-25 ENCOUNTER — Other Ambulatory Visit: Payer: Self-pay | Admitting: Urology

## 2016-07-25 ENCOUNTER — Ambulatory Visit (HOSPITAL_COMMUNITY)
Admission: RE | Admit: 2016-07-25 | Discharge: 2016-07-25 | Disposition: A | Discharge status: Home / Self Care | Payer: BLUE CROSS/BLUE SHIELD | Source: Ambulatory Visit | Attending: Urology | Admitting: Urology

## 2016-07-25 DIAGNOSIS — C642 Malignant neoplasm of left kidney, except renal pelvis: Secondary | ICD-10-CM | POA: Diagnosis not present

## 2018-07-19 IMAGING — DX DG CHEST 2V
2 series · 2 of 2 positions shown · non-contrast
Comparison: PA and lateral chest 03/19/2015.

CLINICAL DATA: History of left renal cell carcinoma diagnosed in
9967.

EXAM:
CHEST  2 VIEW

[chest pa]
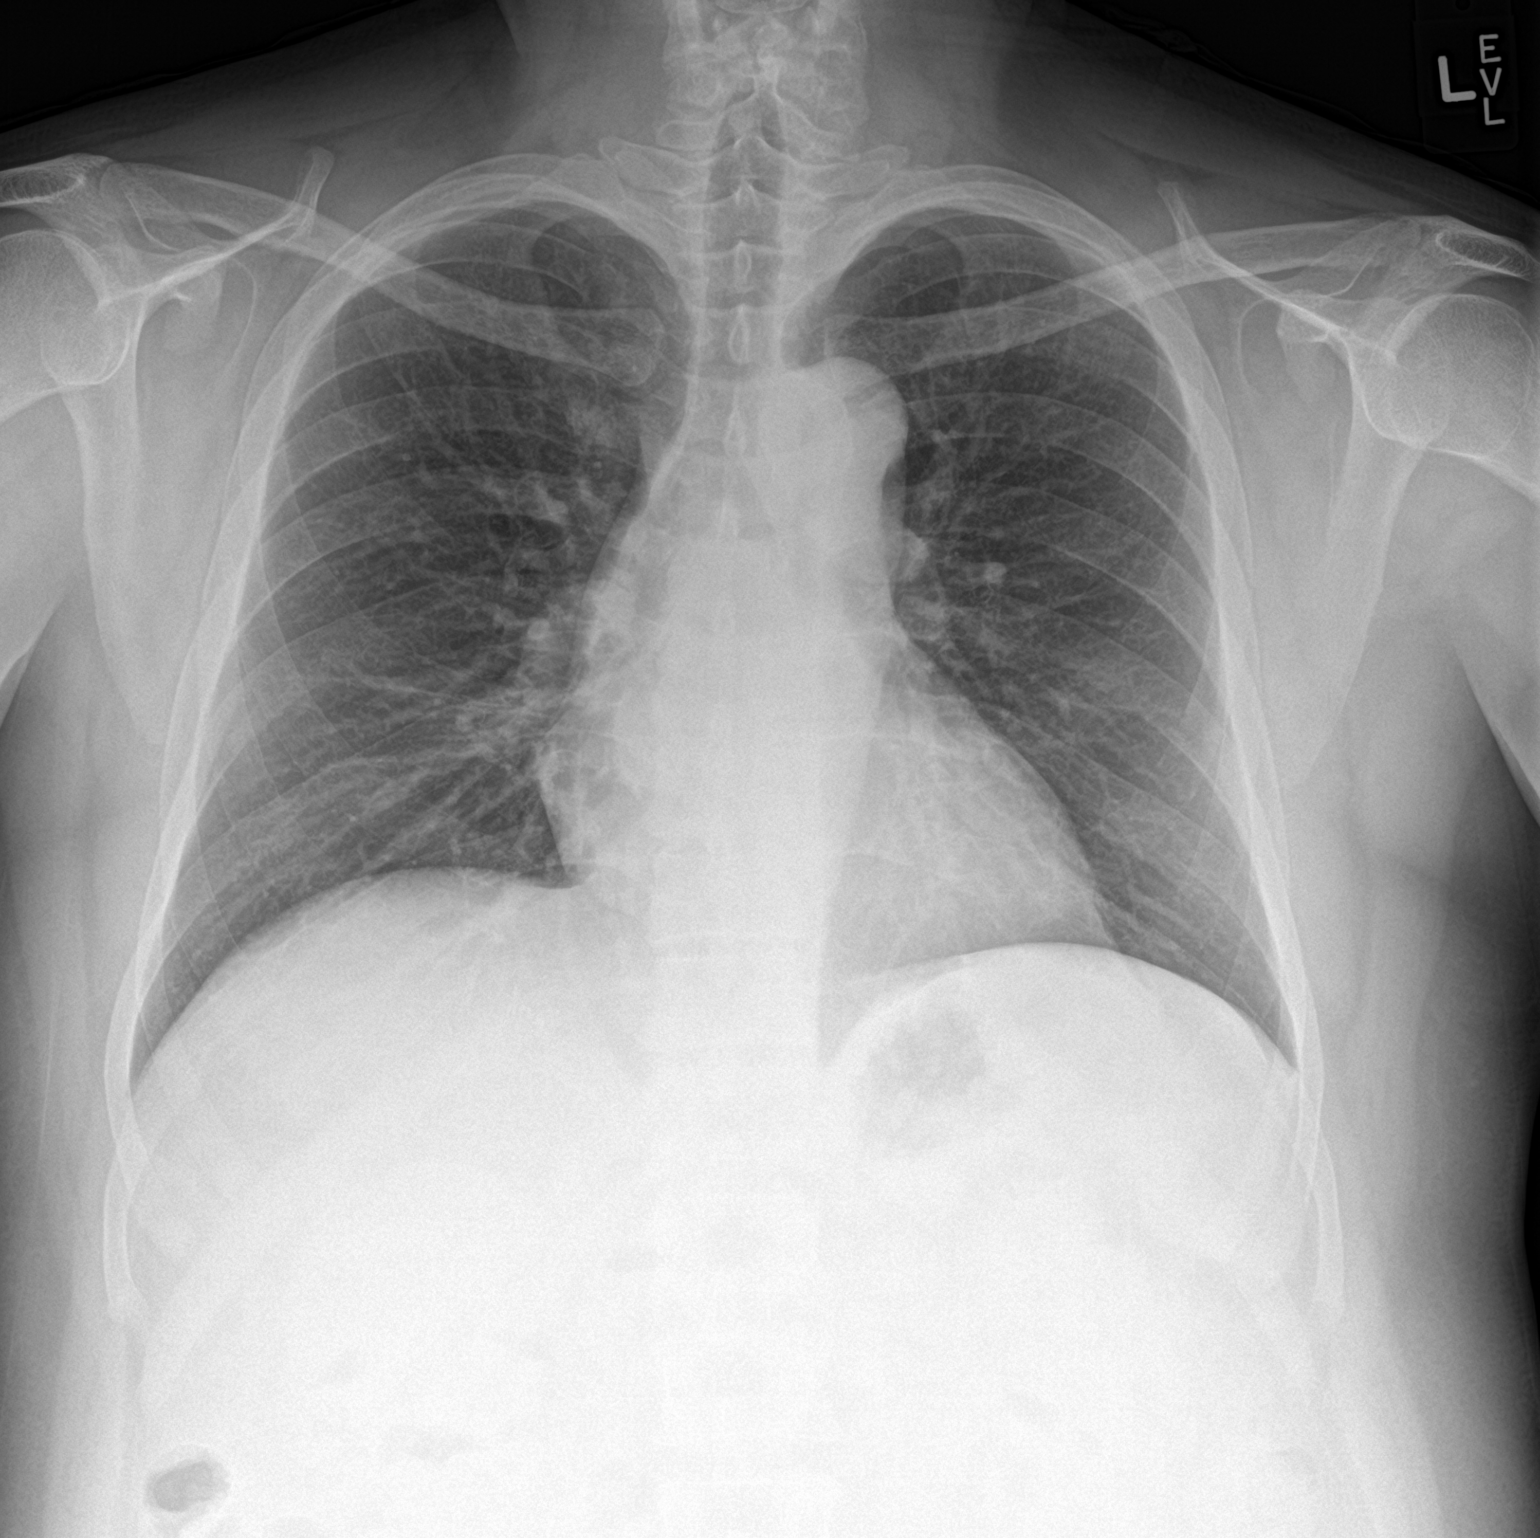

[chest lat]
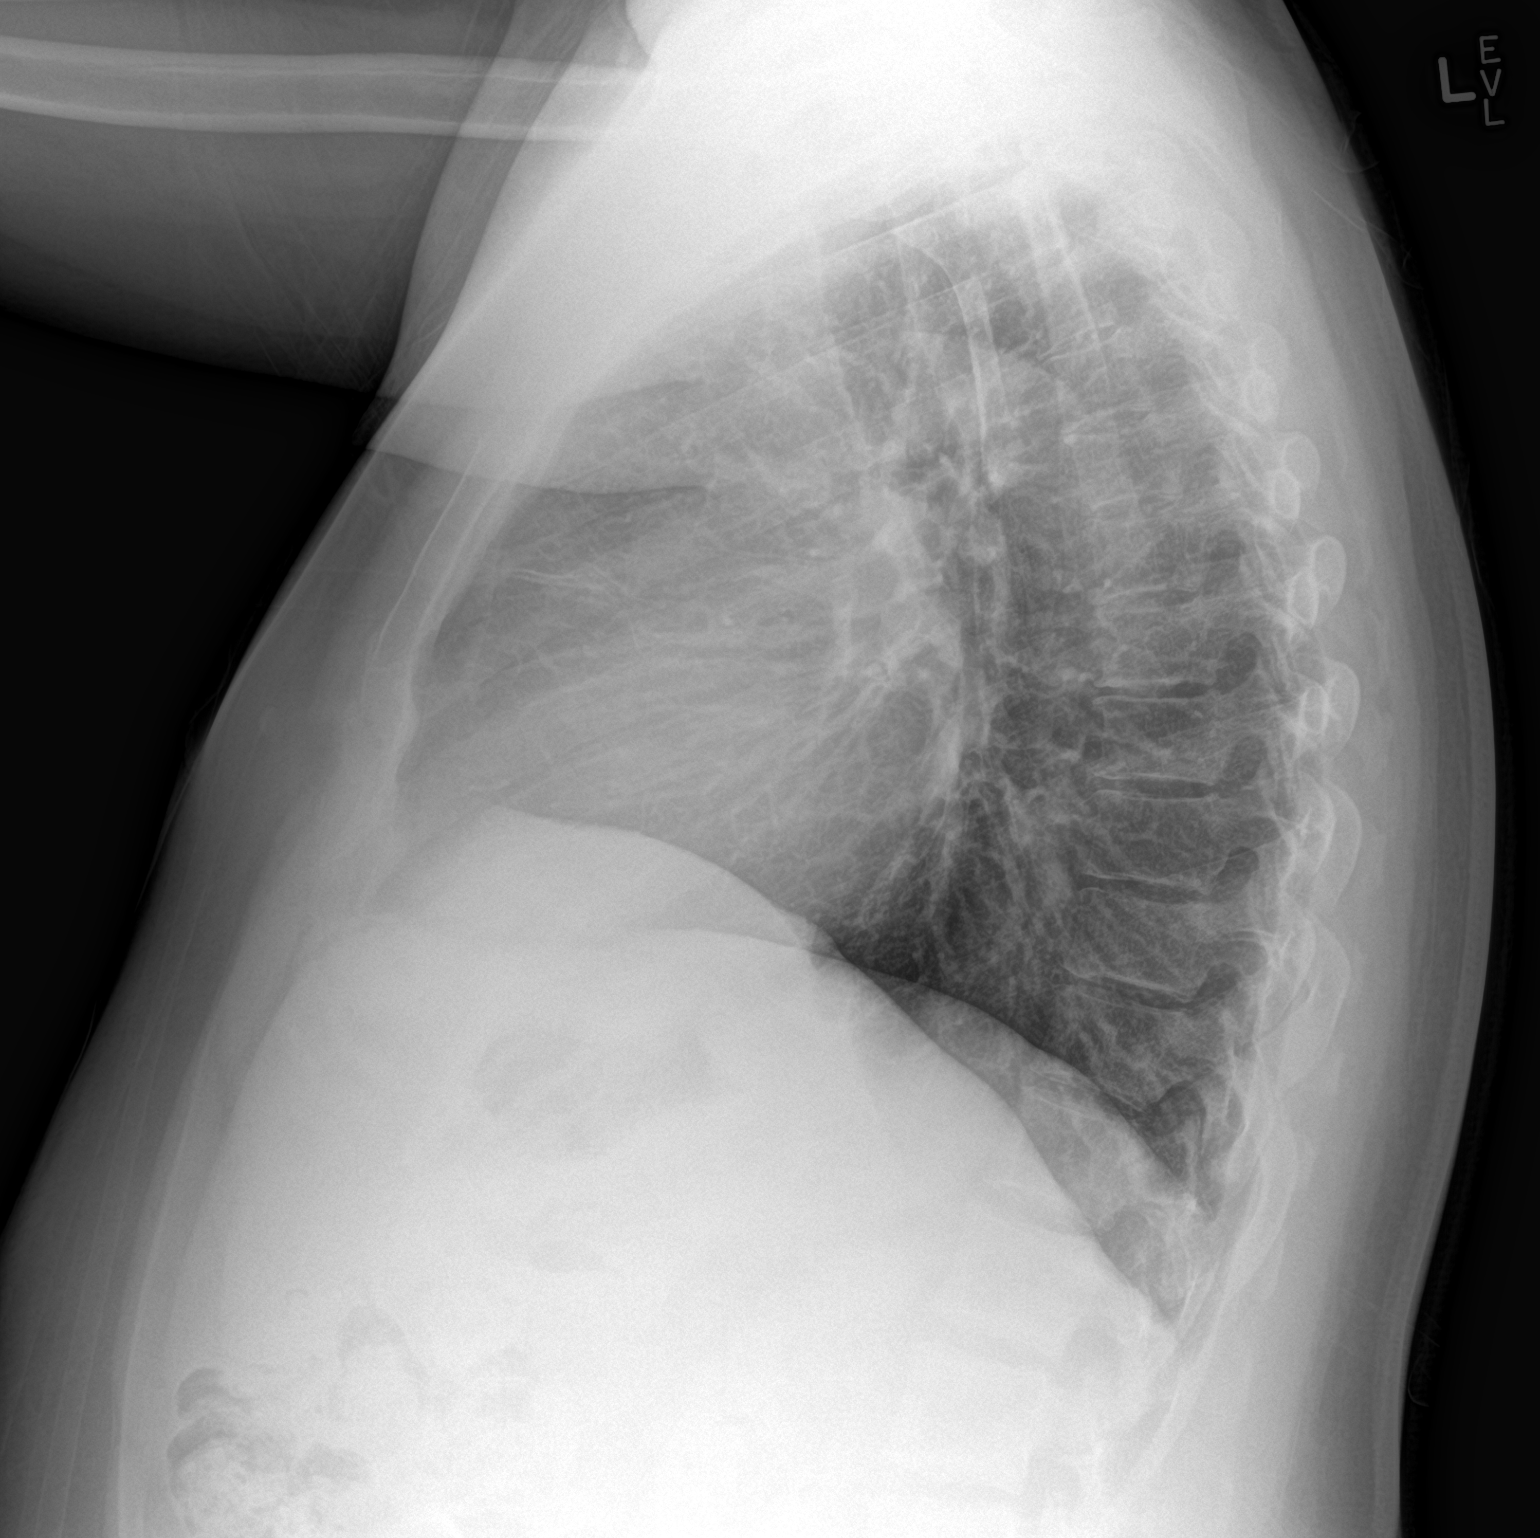

[2 of 2 positions shown; findings below may reference images not displayed]

FINDINGS: The lungs are clear. Heart size is normal. No pneumothorax or
pleural effusion. No bony abnormality.
IMPRESSION: Negative for metastatic or acute disease.

## 2020-08-27 ENCOUNTER — Inpatient Hospital Stay
Admit: 2020-08-27 | Discharge: 2020-08-27 | Disposition: A | Discharge status: Home / Self Care | Payer: PRIVATE HEALTH INSURANCE | Attending: Emergency Medicine

## 2020-08-27 ENCOUNTER — Emergency Department: Admit: 2020-08-27 | Payer: PRIVATE HEALTH INSURANCE

## 2020-08-27 DIAGNOSIS — I10 Essential (primary) hypertension: Secondary | ICD-10-CM

## 2020-08-27 LAB — EKG, 12 LEAD, INITIAL
Atrial Rate: 104 {beats}/min
Calculated P Axis: 42 degrees
Calculated R Axis: -19 degrees
Calculated T Axis: 39 degrees
P-R Interval: 126 ms
Q-T Interval: 366 ms
QRS Duration: 98 ms
QTC Calculation (Bezet): 481 ms
Ventricular Rate: 104 {beats}/min

## 2020-08-27 LAB — CBC WITH AUTOMATED DIFF
ABS. BASOPHILS: 0.1 10*3/uL (ref 0.0–0.1)
ABS. EOSINOPHILS: 0.3 10*3/uL (ref 0.0–0.4)
ABS. IMM. GRANS.: 0.1 10*3/uL — ABNORMAL HIGH (ref 0.00–0.04)
ABS. LYMPHOCYTES: 2.2 10*3/uL (ref 0.9–3.6)
ABS. MONOCYTES: 0.8 10*3/uL (ref 0.05–1.2)
ABS. NEUTROPHILS: 6.4 10*3/uL (ref 1.8–8.0)
ABSOLUTE NRBC: 0 10*3/uL (ref 0.00–0.01)
BASOPHILS: 1 % (ref 0–2)
EOSINOPHILS: 3 % (ref 0–5)
HCT: 47.7 % (ref 36.0–48.0)
HGB: 17 g/dL — ABNORMAL HIGH (ref 13.0–16.0)
IMMATURE GRANULOCYTES: 1 % — ABNORMAL HIGH (ref 0.0–0.5)
LYMPHOCYTES: 23 % (ref 21–52)
MCH: 32.1 PG (ref 24.0–34.0)
MCHC: 35.6 g/dL (ref 31.0–37.0)
MCV: 90 FL (ref 78.0–100.0)
MONOCYTES: 8 % (ref 3–10)
MPV: 10.7 FL (ref 9.2–11.8)
NEUTROPHILS: 65 % (ref 40–73)
NRBC: 0 PER 100 WBC
PLATELET: 260 10*3/uL (ref 135–420)
RBC: 5.3 M/uL (ref 4.35–5.65)
RDW: 12.7 % (ref 11.6–14.5)
WBC: 9.9 10*3/uL (ref 4.6–13.2)

## 2020-08-27 LAB — URINALYSIS W/ RFLX MICROSCOPIC
Bilirubin, Urine: NEGATIVE
Bilirubin: NEGATIVE
Blood, Urine: NEGATIVE
Blood: NEGATIVE
Glucose, Ur: NEGATIVE mg/dL
Glucose: NEGATIVE mg/dL
Ketone: NEGATIVE mg/dL
Ketones, Urine: NEGATIVE mg/dL
Leukocyte Esterase, Urine: NEGATIVE
Leukocyte Esterase: NEGATIVE
Nitrite, Urine: NEGATIVE
Nitrites: NEGATIVE
Protein, UA: 30 mg/dL — AB
Protein: 30 mg/dL — AB
Specific Gravity, UA: 1.015 (ref 1.005–1.030)
Specific gravity: 1.015 (ref 1.005–1.030)
Urobilinogen, UA, POCT: 0.2 EU/dL (ref 0.2–1.0)
Urobilinogen: 0.2 EU/dL (ref 0.2–1.0)
pH (UA): 7 (ref 5.0–8.0)
pH, UA: 7 (ref 5.0–8.0)

## 2020-08-27 LAB — METABOLIC PANEL, BASIC
Anion gap: 7 mmol/L (ref 3.0–18)
BUN/Creatinine ratio: 13 (ref 12–20)
BUN: 11 MG/DL (ref 7.0–18)
CO2: 25 mmol/L (ref 21–32)
Calcium: 8.7 MG/DL (ref 8.5–10.1)
Chloride: 107 mmol/L (ref 100–111)
Creatinine: 0.88 MG/DL (ref 0.6–1.3)
GFR est AA: >60 mL/min/{1.73_m2} (ref >60)
GFR est non-AA: >60 mL/min/{1.73_m2} (ref >60)
Glucose: 122 mg/dL — ABNORMAL HIGH (ref 74–99)
Potassium: 3.4 mmol/L — ABNORMAL LOW (ref 3.5–5.5)
Sodium: 139 mmol/L (ref 136–145)

## 2020-08-27 LAB — URINE MICROSCOPIC ONLY
BACTERIA, URINE: NEGATIVE /hpf
Bacteria: NEGATIVE /hpf
Epithelial Cells, UA: NEGATIVE /lpf (ref 0–5)
Epithelial cells: NEGATIVE /lpf (ref 0–5)

## 2020-08-27 LAB — BASIC METABOLIC PANEL
Anion Gap: 7 mmol/L (ref 3.0–18)
BUN: 11 MG/DL (ref 7.0–18)
Bun/Cre Ratio: 13 (ref 12–20)
CO2: 25 mmol/L (ref 21–32)
Calcium: 8.7 MG/DL (ref 8.5–10.1)
Chloride: 107 mmol/L (ref 100–111)
Creatinine: 0.88 MG/DL (ref 0.6–1.3)
EGFR IF NonAfrican American: >60 mL/min/{1.73_m2} (ref >60)
GFR African American: >60 mL/min/{1.73_m2} (ref >60)
Glucose: 122 mg/dL — ABNORMAL HIGH (ref 74–99)
Potassium: 3.4 mmol/L — ABNORMAL LOW (ref 3.5–5.5)
Sodium: 139 mmol/L (ref 136–145)

## 2020-08-27 LAB — CBC WITH AUTO DIFFERENTIAL
Basophils %: 1 % (ref 0–2)
Basophils Absolute: 0.1 10*3/uL (ref 0.0–0.1)
Eosinophils %: 3 % (ref 0–5)
Eosinophils Absolute: 0.3 10*3/uL (ref 0.0–0.4)
Granulocyte Absolute Count: 0.1 10*3/uL — ABNORMAL HIGH (ref 0.00–0.04)
Hematocrit: 47.7 % (ref 36.0–48.0)
Hemoglobin: 17 g/dL — ABNORMAL HIGH (ref 13.0–16.0)
Immature Granulocytes: 1 % — ABNORMAL HIGH (ref 0.0–0.5)
Lymphocytes %: 23 % (ref 21–52)
Lymphocytes Absolute: 2.2 10*3/uL (ref 0.9–3.6)
MCH: 32.1 PG (ref 24.0–34.0)
MCHC: 35.6 g/dL (ref 31.0–37.0)
MCV: 90 FL (ref 78.0–100.0)
MPV: 10.7 FL (ref 9.2–11.8)
Monocytes %: 8 % (ref 3–10)
Monocytes Absolute: 0.8 10*3/uL (ref 0.05–1.2)
NRBC Absolute: 0 10*3/uL (ref 0.00–0.01)
Neutrophils %: 65 % (ref 40–73)
Neutrophils Absolute: 6.4 10*3/uL (ref 1.8–8.0)
Nucleated RBCs: 0 PER 100 WBC
Platelets: 260 10*3/uL (ref 135–420)
RBC: 5.3 M/uL (ref 4.35–5.65)
RDW: 12.7 % (ref 11.6–14.5)
WBC: 9.9 10*3/uL (ref 4.6–13.2)

## 2020-08-27 LAB — EKG 12-LEAD
Atrial Rate: 104 {beats}/min
P Axis: 42 degrees
P-R Interval: 126 ms
Q-T Interval: 366 ms
QRS Duration: 98 ms
QTc Calculation (Bazett): 481 ms
R Axis: -19 degrees
T Axis: 39 degrees
Ventricular Rate: 104 {beats}/min

## 2020-08-27 MED ORDER — METOPROLOL TARTRATE 5 MG/5 ML IV SOLN
5 mg/ mL | INTRAVENOUS | Status: DC
Start: 2020-08-27 — End: 2020-08-27

## 2020-08-27 MED ORDER — POTASSIUM CHLORIDE ER 20 MEQ TABLET,EXTENDED RELEASE
20 mEq | ORAL_TABLET | Freq: Every day | ORAL | 0 refills | Status: AC
Start: 2020-08-27 — End: 2020-08-31

## 2020-08-27 MED ORDER — AMLODIPINE 5 MG TAB
5 mg | ORAL_TABLET | Freq: Every day | ORAL | 0 refills | Status: AC
Start: 2020-08-27 — End: ?

## 2020-08-27 MED FILL — METOPROLOL TARTRATE 5 MG/5 ML IV SOLN: 5 mg/ mL | INTRAVENOUS | Qty: 5

## 2020-08-27 NOTE — ED Notes (Signed)
Patient c/o awakening this morning with a very bad headache and dizziness.  He states taking Tylenol Migraine at 0800.  Advises that he had [facility] appt at 0930 and it was noted that his blood pressure was really high.  States having BP of 241/173.   He states taking a dose of his mother's blood pressure medications: Lisinopril 40 mg and nifedipine 60 mg at 0945 today.  He advises that he was instructed by [facility] to report to ER for further evaluation and treatment .

## 2020-08-27 NOTE — ED Notes (Signed)
I have reviewed discharge instructions with the patient.  The patient verbalized understanding.

## 2020-08-27 NOTE — ED Provider Notes (Signed)
EMERGENCY DEPARTMENT HISTORY AND PHYSICAL EXAM    11:56 AM      Date: 08/27/2020  Patient Name: Herbert Burns    History of Presenting Illness     Chief Complaint   Patient presents with   ??? Headache   ??? Dizziness   ??? Elevated Blood Pressure         History Provided By: Patient    Additional History (Context): Herbert Burns is a 50 y.o. male with Past medical history of hypertension, bipolar disorder who presents with chief complaint of headache today.  Associated symptom is dizziness that he describes as being lightheaded.  He reports that he had a virtual doctor appointment this morning at 9:30 AM and he states that he checked and found that his blood pressure was 241/173 and he was instructed to go to the emergency department for evaluation.  He states that he has been off of his blood pressure medicine for over 2 years.  He decided to take his mother's lisinopril and nifedipine at 9:45 AM today.  He also had taken Tylenol migraine medication.  He states that his headache is improving.  No chest pain, numbness, weakness, blurred vision, speech change, shortness of breath, fever, and no other complaints.    PCP: None        Past History     Past Medical History:  Past Medical History:   Diagnosis Date   ??? Bipolar disorder (HCC)    ??? Hypertension    ??? Personal history of kidney cancer        Past Surgical History:  Past Surgical History:   Procedure Laterality Date   ??? HX NEPHRECTOMY Left     partial       Family History:  History reviewed. No pertinent family history.    Social History:  Social History     Tobacco Use   ??? Smoking status: Never Smoker   ??? Smokeless tobacco: Never Used   Substance Use Topics   ??? Alcohol use: Never   ??? Drug use: Never       Allergies:  No Known Allergies      Review of Systems       Review of Systems   Constitutional: Negative for chills and fever.   HENT: Negative for congestion, rhinorrhea, sore throat and trouble swallowing.    Eyes: Negative for visual disturbance.   Respiratory:  Negative for cough, shortness of breath and wheezing.    Cardiovascular: Negative for chest pain and leg swelling.   Gastrointestinal: Negative for abdominal pain, nausea and vomiting.   Endocrine: Negative for polyuria.   Genitourinary: Negative for difficulty urinating and dysuria.   Musculoskeletal: Negative for arthralgias and neck stiffness.   Skin: Negative for rash.   Neurological: Positive for light-headedness and headaches. Negative for dizziness, weakness and numbness.   Hematological: Does not bruise/bleed easily.   Psychiatric/Behavioral: Negative for confusion and dysphoric mood.   All other systems reviewed and are negative.        Physical Exam     Visit Vitals  BP 139/81   Pulse 97   Temp 98.4 ??F (36.9 ??C)   Resp 14   Ht 5\' 8"  (1.727 m)   Wt 113.4 kg (250 lb)   SpO2 95%   BMI 38.01 kg/m??         Physical Exam  Vitals and nursing note reviewed.   Constitutional:       General: He is not in acute distress.  Appearance: He is well-developed. He is not ill-appearing or diaphoretic.   HENT:      Head: Normocephalic and atraumatic.      Right Ear: External ear normal.      Left Ear: External ear normal.      Nose: Nose normal.      Mouth/Throat:      Mouth: Mucous membranes are moist.   Eyes:      General: No scleral icterus.     Conjunctiva/sclera: Conjunctivae normal.      Pupils: Pupils are equal, round, and reactive to light.   Cardiovascular:      Rate and Rhythm: Normal rate.      Pulses: Normal pulses.      Heart sounds: Normal heart sounds. No murmur heard.  No gallop.    Pulmonary:      Effort: Pulmonary effort is normal. No respiratory distress.      Breath sounds: Normal breath sounds. No wheezing or rales.   Abdominal:      General: Bowel sounds are normal. There is no distension.      Palpations: Abdomen is soft. There is no mass.      Tenderness: There is no abdominal tenderness. There is no guarding.   Musculoskeletal:         General: No tenderness. Normal range of motion.      Cervical  back: Normal range of motion and neck supple. No tenderness.   Lymphadenopathy:      Cervical: No cervical adenopathy.   Skin:     General: Skin is warm and dry.      Capillary Refill: Capillary refill takes less than 2 seconds.      Coloration: Skin is not jaundiced or pale.   Neurological:      General: No focal deficit present.      Mental Status: He is alert and oriented to person, place, and time.      Cranial Nerves: No cranial nerve deficit.      Sensory: No sensory deficit.      Motor: No weakness.      Coordination: Coordination normal.      Comments: No cerebellar ataxia on finger-nose test  Sensation intact  No drifting  No slurred speech  No facial droop   Psychiatric:         Mood and Affect: Mood normal.         Thought Content: Thought content normal.           Diagnostic Study Results     Labs -  Recent Results (from the past 12 hour(s))   EKG, 12 LEAD, INITIAL    Collection Time: 08/27/20 11:35 AM   Result Value Ref Range    Ventricular Rate 104 BPM    Atrial Rate 104 BPM    P-R Interval 126 ms    QRS Duration 98 ms    Q-T Interval 366 ms    QTC Calculation (Bezet) 481 ms    Calculated P Axis 42 degrees    Calculated R Axis -19 degrees    Calculated T Axis 39 degrees    Diagnosis       Sinus tachycardia  Incomplete right bundle branch block  Voltage criteria for left ventricular hypertrophy  Abnormal ECG  No previous ECGs available     CBC WITH AUTOMATED DIFF    Collection Time: 08/27/20 12:18 PM   Result Value Ref Range    WBC 9.9 4.6 - 13.2 K/uL  RBC 5.30 4.35 - 5.65 M/uL    HGB 17.0 (H) 13.0 - 16.0 g/dL    HCT 62.7 03.5 - 00.9 %    MCV 90.0 78.0 - 100.0 FL    MCH 32.1 24.0 - 34.0 PG    MCHC 35.6 31.0 - 37.0 g/dL    RDW 38.1 82.9 - 93.7 %    PLATELET 260 135 - 420 K/uL    MPV 10.7 9.2 - 11.8 FL    NRBC 0.0 0 PER 100 WBC    ABSOLUTE NRBC 0.00 0.00 - 0.01 K/uL    NEUTROPHILS 65 40 - 73 %    LYMPHOCYTES 23 21 - 52 %    MONOCYTES 8 3 - 10 %    EOSINOPHILS 3 0 - 5 %    BASOPHILS 1 0 - 2 %    IMMATURE  GRANULOCYTES 1 (H) 0.0 - 0.5 %    ABS. NEUTROPHILS 6.4 1.8 - 8.0 K/UL    ABS. LYMPHOCYTES 2.2 0.9 - 3.6 K/UL    ABS. MONOCYTES 0.8 0.05 - 1.2 K/UL    ABS. EOSINOPHILS 0.3 0.0 - 0.4 K/UL    ABS. BASOPHILS 0.1 0.0 - 0.1 K/UL    ABS. IMM. GRANS. 0.1 (H) 0.00 - 0.04 K/UL    DF AUTOMATED     METABOLIC PANEL, BASIC    Collection Time: 08/27/20 12:18 PM   Result Value Ref Range    Sodium 139 136 - 145 mmol/L    Potassium 3.4 (L) 3.5 - 5.5 mmol/L    Chloride 107 100 - 111 mmol/L    CO2 25 21 - 32 mmol/L    Anion gap 7 3.0 - 18 mmol/L    Glucose 122 (H) 74 - 99 mg/dL    BUN 11 7.0 - 18 MG/DL    Creatinine 1.69 0.6 - 1.3 MG/DL    BUN/Creatinine ratio 13 12 - 20      GFR est AA >60 >60 ml/min/1.64m2    GFR est non-AA >60 >60 ml/min/1.72m2    Calcium 8.7 8.5 - 10.1 MG/DL   URINALYSIS W/ RFLX MICROSCOPIC    Collection Time: 08/27/20 12:55 PM   Result Value Ref Range    Color YELLOW      Appearance CLEAR      Specific gravity 1.015 1.005 - 1.030      pH (UA) 7.0 5.0 - 8.0      Protein 30 (A) NEG mg/dL    Glucose Negative NEG mg/dL    Ketone Negative NEG mg/dL    Bilirubin Negative NEG      Blood Negative NEG      Urobilinogen 0.2 0.2 - 1.0 EU/dL    Nitrites Negative NEG      Leukocyte Esterase Negative NEG     URINE MICROSCOPIC ONLY    Collection Time: 08/27/20 12:55 PM   Result Value Ref Range    WBC NONE 0 - 4 /hpf    RBC NONE 0 - 5 /hpf    Epithelial cells Negative 0 - 5 /lpf    Bacteria Negative NEG /hpf    Amorphous Crystals 1+ (A) NEG       Radiologic Studies -   CT HEAD WO CONT   Final Result   No acute intracranial hemorrhage.   Small left mastoid effusion.   Mild sequela of chronic microvascular ischemic disease.               Medical Decision Making   I am the  first provider for this patient.    I reviewed the vital signs, available nursing notes, past medical history, past surgical history, family history and social history.    Vital Signs-Reviewed the patient's vital signs.    Pulse Oximetry Analysis -  97% on room  air (Interpretation) normal    Cardiac Monitor:  Rate: 111  Rhythm:  Sinus Tachycardia     EKG:  Interpreted by the EP Dr. Kristeen Mans.   Time Interpreted: 11:36 AM   Rate: 104   Rhythm: Sinus Tachycardia    Interpretation: Normal QRS duration, incomplete right bundle branch block, no ST elevation, no ST depression, no STEMI       Records Reviewed: Nursing Notes and Old Medical Records (Time of Review: 11:56 AM)    Provider Notes (Medical Decision Making):  MDM     DDx: Elevated blood pressure history of hypertension, ICH, brain mass, metabolic    Doubt SAH    Check labs, give antihypertensive, get CT brain    Medications - No data to display        ED Course: Progress Notes, Reevaluation, and Consults:  Prior to patient getting metoprolol his blood pressure had already significantly improved so we did not give any antihypertensive.  Blood pressure 154/98, she was 169/111 on arrival.    No alarming labs except potassium slightly low at 3.4.    I have reassessed the patient. I have discussed the workup, results and plan with the patient and patient is in agreement.  Patient is feeling better.  Patient will be prescribed amlodipine.  Patient was discharge in stable condition. Patient was given outpatient follow up.  Patient is to return to emergency department if any new or worsening condition.    Diagnosis     Clinical Impression:   1. Hypertension, unspecified type    2. Nonintractable episodic headache, unspecified headache type        Disposition: Discharged    Follow-up Information     Follow up With Specialties Details Why Contact Info    [facility]  Schedule an appointment as soon as possible for a visit in 3 days  [address]  [place] [address]  [phone]    [facility] Emergency Medicine  As needed, If symptoms worsen [address] [place] [address]  [phone]           Patient's Medications   Start Taking    AMLODIPINE (NORVASC) 5 MG TABLET    Take 1  Tablet by mouth daily. Indications: high blood pressure    POTASSIUM CHLORIDE SR (K-TAB) 20 MEQ TABLET    Take 1 Tablet by mouth daily for 4 days. Indications: low amount of potassium in the blood   Continue Taking    No medications on file   These Medications have changed    No medications on file   Stop Taking    No medications on file         Angelena Sole, DO    Dragon medical dictation software was used for portions of this report.   Unintended transcription errors may occur.     My signature above authenticates this document and my orders, the final    diagnosis (es), discharge prescription (s), and instructions in the Epic    record.

## 2020-08-27 NOTE — ED Notes (Signed)
Patient states that he has not been on blood pressure medications x 2 years.

## 2022-10-05 ENCOUNTER — Encounter

## 2022-10-23 NOTE — Telephone Encounter (Signed)
Per voice mail received. Called patient back @ [phone] to confirm appointment with Dr Doristine Johns on 11-23-22 @ 3:00 pm.

## 2022-11-02 ENCOUNTER — Ambulatory Visit: Payer: BLUE CROSS/BLUE SHIELD

## 2022-11-02 DIAGNOSIS — I10 Essential (primary) hypertension: Secondary | ICD-10-CM

## 2022-11-06 DIAGNOSIS — I10 Essential (primary) hypertension: Secondary | ICD-10-CM

## 2022-11-06 LAB — VAS RENAL ARTERIAL DUP COMPLETE
Ao at renal arteries PSV: 79.7 cm/s
Ao prox PSV: 79.7 cm/s
Left Arcuate Renal Artery EDV: 5.7 cm/s
Left Arcuate Renal Artery PSV: 14.9 cm/s
Left Arcuate Renal Artery RI: 0.62
Left Hilar EDV: 18.8 cm/s
Left Hilar PSV: 43.2 cm/s
Left Renal Arcuate Accel Time: 0.1 s
Left Renal Dist RI: 0.63 no units
Left Renal Segmental Accel Time: 0.09 s
Left kidney length: 10.66 cm
Left kidney width: 5.54 cm
Left renal dist EDV: 40.3 cm/s
Left renal dist PSV: 108 cm/s
Left renal middle parenchyma EDV: 12.7 cm/s
Left renal middle parenchyma PSV: 32.8 cm/s
Left renal middle parenchyma RI: 0.61
Right Arcuate Renal Artery EDV: 0 cm/s
Right Arcuate Renal Artery PSV: 15.5 cm/s
Right Arcuate Renal Artery RI: 1
Right Hilar EDV: 26.4 cm/s
Right Hilar PSV: 72.7 cm/s
Right Renal Arcuate Accel Time: 0.23 s
Right Renal Dist RI: 0.73 no units
Right Renal Segmental Accel Time: 0.06 s
Right kidney length: 12.97 cm
Right kidney width: 6.99 cm
Right renal dist EDV: 25.4 cm/s
Right renal dist PSV: 94.2 cm/s
Right renal middle parenchyma EDV: 16.1 cm/s
Right renal middle parenchyma PSV: 41.6 cm/s
Right renal middle parenchyma RI: 0.61

## 2022-11-23 ENCOUNTER — Ambulatory Visit
Admit: 2022-11-23 | Discharge: 2022-11-23 | Payer: BLUE CROSS/BLUE SHIELD | Attending: Internal Medicine | Primary: Internal Medicine

## 2022-11-23 VITALS — BP 150/110 | HR 92 | Wt 257.0 lb

## 2022-11-23 DIAGNOSIS — I10 Essential (primary) hypertension: Secondary | ICD-10-CM

## 2022-11-23 NOTE — Progress Notes (Signed)
 History of Present Illness:  This is a 52 year-old male referred by Dr. Zev for evaluation for CVD.  He had an abnormal EKG with LVH.  He has longstanding hypertension for over 20 years.  He has chest pains at times, sometimes when he gets a little nervous or anxious or stressed.  He walks at times with his mother in his 16s and has not had any significant chest pain or shortness of breath with this.  His BMI, however, is 39 and he is not overly active.  He was diagnosed with diabetes this past year and is on Metformin .  He does have headaches in the morning and was told he has been a heavy snorer ever since a young age.     Impression:  Abnormal EKG with LVH.   Intermittent chest pain, usually with stress, but not exertion.  Limited functional status with a BMI of 39.   Hypertension for over 20 years on two agents.   Diagnosis of diabetes over the past year, now on Metformin .    Morning headaches in the setting of being told he was a heavy snorer since a young age.      Plan:  Given his abnormal EKG with multiple risk factors and intermittent chest pain with stress, I recommended pharmacologic nuclear stress test, as well as echocardiogram.  He can see back an APC and if abnormal have a low threshold for heart catheterization.  He would also benefit from evaluation for sleep apnea as well, which may be contributing to some of his high blood pressure.  We will plan to see him back after testing.        Wt Readings from Last 3 Encounters:   11/23/22 116.6 kg (257 lb)     Past Medical History:   Diagnosis Date    Bipolar disorder (HCC)     Hypertension     Personal history of kidney cancer        Current Outpatient Medications   Medication Sig Dispense Refill    allopurinol  (ZYLOPRIM ) 100 MG tablet Take 1 tablet by mouth Every Day      amLODIPine  (NORVASC ) 10 MG tablet       aspirin  81 MG EC tablet Take 1 tablet by mouth Every Day      losartan  (COZAAR ) 100 MG tablet 1 tablet Orally once at night for 90 days       metFORMIN  (GLUCOPHAGE -XR) 500 MG extended release tablet Take 1 tablet by mouth Every Day       No current facility-administered medications for this visit.       Social History   reports that he has never smoked. He has never used smokeless tobacco.   reports no history of alcohol use.    Family History  family history is not on file.    Review of Systems  Except as stated above include:  Constitutional: Negative for fever, chills and malaise/fatigue.   HEENT: No congestion or recent URI.  Gastrointestinal: No nausea, vomiting, abdominal pain, bloody stools.  Pulmonary:  Negative except as stated above.  Cardiac:  Negative except as stated above.  Musculoskeletal: Negative except as stated above.  Neurological:  No localized symptoms.  Endocrine:  Negative except as stated above.    PHYSICAL EXAM  BP Readings from Last 3 Encounters:   11/23/22 (!) 150/110     Pulse Readings from Last 3 Encounters:   11/23/22 92     General:   Well developed, well groomed.  Head/Neck:   No obvious jugular venous distention     No obvious carotid pulsations.      No evidence of xanthelasma.  Lungs:   No respiratory distress.      Clear bilaterally.  Heart:  Regular rate and rhythm.    Abdomen:   Soft and nontender  Extremities:   Intact peripheral pulses.      No significant edema.    Neurological:   Alert and oriented to person, place, time.      No focal neurological deficit visually.  Skin:   No obvious rash

## 2023-01-17 ENCOUNTER — Ambulatory Visit: Admit: 2023-01-17 | Discharge: 2023-01-23 | Payer: BLUE CROSS/BLUE SHIELD | Primary: Internal Medicine

## 2023-01-17 DIAGNOSIS — R9431 Abnormal electrocardiogram [ECG] [EKG]: Secondary | ICD-10-CM

## 2023-01-17 LAB — ECHO (TTE) COMPLETE (PRN CONTRAST/BUBBLE/STRAIN/3D)
Ao Root Index: 1.5 cm/m2
Aortic Root: 3.4 cm
Ascending Aorta Index: 1.45 cm/m2
Ascending Aorta: 3.3 cm
Body Surface Area: 2.36 m2
E/E' Lateral: 10.17
E/E' Ratio (Averaged): 9.5
E/E' Septal: 8.82
EF Physician: 50 %
Fractional Shortening 2D: 24 % (ref 28–44)
IVSd: 1 cm (ref 0.6–1.0)
LA Diameter: 4.1 cm
LA Size Index: 1.81 cm/m2
LA Volume A-L A4C: 76 mL — AB (ref 18–58)
LA Volume A-L A4C: 89 mL — AB (ref 18–58)
LA Volume A/L: 86 mL
LA Volume Index A-L A2C: 39 mL/m2 — AB (ref 16–34)
LA Volume Index A-L A4C: 33 mL/m2 (ref 16–34)
LA Volume Index A/L: 38 mL/m2 (ref 16–34)
LA Volume Index MOD A2C: 37 mL/m2 — AB (ref 16–34)
LA Volume Index MOD A4C: 31 mL/m2 (ref 16–34)
LA Volume MOD A2C: 85 mL — AB (ref 18–58)
LA Volume MOD A4C: 70 mL — AB (ref 18–58)
LA/AO Root Ratio: 1.21
LV E' Lateral Velocity: 5.9 cm/s
LV E' Septal Velocity: 6.8 cm/s
LV EDV A2C: 152 mL
LV EDV A4C: 177 mL
LV EDV BP: 166 mL — AB (ref 67–155)
LV EDV Index A2C: 67 mL/m2
LV EDV Index A4C: 78 mL/m2
LV EDV Index BP: 73 mL/m2
LV ESV A2C: 77 mL
LV ESV A4C: 105 mL
LV ESV BP: 91 mL — AB (ref 22–58)
LV ESV Index A2C: 34 mL/m2
LV ESV Index A4C: 46 mL/m2
LV ESV Index BP: 40 mL/m2
LV Ejection Fraction A2C: 49 %
LV Ejection Fraction A4C: 41 %
LV Mass 2D Index: 93.9 g/m2 (ref 49–115)
LV Mass 2D: 213.2 g (ref 88–224)
LV RWT Ratio: 0.36
LVIDd Index: 2.42 cm/m2
LVIDd: 5.5 cm (ref 4.2–5.9)
LVIDs Index: 1.85 cm/m2
LVIDs: 4.2 cm
LVOT Area: 4.5 cm2
LVOT Diameter: 2.4 cm
LVPWd: 1 cm (ref 0.6–1.0)
MV A Velocity: 0.68 m/s
MV E Velocity: 0.6 m/s
MV E Wave Deceleration Time: 250.3 ms
MV E/A: 0.88
RA Volume BP: 34 mL
RA Volume Index BP: 15 mL/m2
RVIDd: 3.7 cm
TAPSE: 1.8 cm (ref 1.7–?)

## 2023-01-17 LAB — NM STRESS TEST WITH MYOCARDIAL PERFUSION
Baseline Diastolic BP: 86 mm[Hg]
Baseline HR: 65 {beats}/min
Baseline ST Depression: 0 mm
Baseline Systolic BP: 140 mm[Hg]
Body Surface Area: 2.36 m2
Nuc Stress EF: 53 %
Recovery Stage 1 Duration: 1 min:sec
Recovery Stage 1 HR: 85 {beats}/min
Stress Diastolic BP: 86 mm[Hg]
Stress Estimated Workload: 1 METS
Stress Peak HR: 93 {beats}/min
Stress Percent HR Achieved: 55 %
Stress Rate Pressure Product: 13020 BPM*mmHg
Stress ST Depression: 0 mm
Stress Stage 1 Duration: 3 min:sec
Stress Stage 1 HR: 89 {beats}/min
Stress Systolic BP: 140 mm[Hg]
Stress Target HR: 169 {beats}/min
TID: 0.96

## 2023-01-17 MED ORDER — TECHNETIUM TC 99M TETROFOSMIN IV KIT
Freq: Once | INTRAVENOUS | Status: AC | PRN
Start: 2023-01-17 — End: 2023-01-17
  Administered 2023-01-17: 13:00:00 9.07 via INTRAVENOUS

## 2023-01-17 MED ORDER — TECHNETIUM TC 99M TETROFOSMIN IV KIT
Freq: Once | INTRAVENOUS | Status: AC | PRN
Start: 2023-01-17 — End: 2023-01-17
  Administered 2023-01-17: 14:00:00 26 via INTRAVENOUS

## 2023-01-17 MED ORDER — REGADENOSON 0.4 MG/5ML IV SOLN
0.4 | Freq: Once | INTRAVENOUS | Status: AC | PRN
Start: 2023-01-17 — End: 2023-01-17
  Administered 2023-01-17: 14:00:00 0.4 mg via INTRAVENOUS

## 2023-01-17 NOTE — Progress Notes (Signed)
Patient was given 9.36mCi of Myoview in LACF for the resting images.  Patient was also given 26.  Myoview in LACF for the stress images  Injected 0.4mg  Lexiscan.

## 2023-01-25 ENCOUNTER — Encounter: Payer: BLUE CROSS/BLUE SHIELD | Attending: Physician Assistant | Primary: Internal Medicine

## 2023-04-26 ENCOUNTER — Ambulatory Visit
Admit: 2023-04-26 | Discharge: 2023-04-26 | Payer: BLUE CROSS/BLUE SHIELD | Attending: Internal Medicine | Primary: Internal Medicine

## 2023-04-26 ENCOUNTER — Encounter: Payer: BLUE CROSS/BLUE SHIELD | Attending: Physician Assistant | Primary: Internal Medicine

## 2023-04-26 ENCOUNTER — Encounter: Payer: BLUE CROSS/BLUE SHIELD | Attending: Internal Medicine | Primary: Internal Medicine

## 2023-04-26 VITALS — BP 118/62 | HR 86 | Ht 68.0 in | Wt 261.0 lb

## 2023-04-26 DIAGNOSIS — R9431 Abnormal electrocardiogram [ECG] [EKG]: Secondary | ICD-10-CM

## 2023-04-26 NOTE — Progress Notes (Signed)
History of Present Illness:  53 year-old male here for followup.  He was initially referred by Dr. Barnabas Lister for evaluation for cardiovascular disease.  He had an abnormal EKG with LVH, longstanding hypertension for 20 years.  Sometimes, he will get some chest pain when he gets anxious or stressed, but this is improved.  He is on Metformin for diabetes.  He is actually doing relatively well without any new chest pain, dyspnea, PND, orthopnea or edema.      Impression:   Abnormal EKG with LVH.    Echocardiogram 12/2022 with EF of 55%, mild left atrial enlargement.    Stress test 12/2022 low risk, however, there was a small basal to mid inferior defect, likely artifact without convincing evidence for ischemia.    Diabetes on Metformin.   Hypertension for 20 years on two agents.    BMI of 39.     Plan:  I reviewed his stress test and echocardiogram.  His stress test overall was low risk, although there was a small basal inferior defect, likely artifact.  Since he has no symptoms at this point and he is doing well, we talked about ongoing aggressive risk factor modification and no need to pursue more invasive workup.  Given the mild abnormality, however, and his multiple risk factors, I talked about seeing him back in a year and potentially repeating testing at that time for completeness.  If he does develop any new symptoms, he will let me know.      Thank you kindly for allowing me to participate in this patient's care.        Wt Readings from Last 3 Encounters:   04/26/23 118.4 kg (261 lb)   01/17/23 116.6 kg (257 lb)   01/17/23 116.6 kg (257 lb)     Past Medical History:   Diagnosis Date    Bipolar disorder (HCC)     Hypertension     Personal history of kidney cancer        Current Outpatient Medications   Medication Sig Dispense Refill    allopurinol (ZYLOPRIM) 100 MG tablet Take 1 tablet by mouth Every Day      amLODIPine (NORVASC) 10 MG tablet       aspirin 81 MG EC tablet Take 1 tablet by mouth Every Day      losartan  (COZAAR) 100 MG tablet 1 tablet Orally once at night for 90 days      metFORMIN (GLUCOPHAGE-XR) 500 MG extended release tablet Take 1 tablet by mouth Every Day       No current facility-administered medications for this visit.       Social History   reports that he has never smoked. He has never used smokeless tobacco.   reports current alcohol use.    Family History  family history is not on file.    Review of Systems  Except as stated above include:  Constitutional: Negative for fever, chills and malaise/fatigue.   HEENT: No congestion or recent URI.  Gastrointestinal: No nausea, vomiting, abdominal pain, bloody stools.  Pulmonary:  Negative except as stated above.  Cardiac:  Negative except as stated above.  Musculoskeletal: Negative except as stated above.  Neurological:  No localized symptoms.  Endocrine:  Negative except as stated above.    PHYSICAL EXAM  BP Readings from Last 3 Encounters:   04/26/23 118/62   01/17/23 (!) 150/110   01/17/23 (!) 140/86     Pulse Readings from Last 3 Encounters:   04/26/23 86  01/17/23 65   11/23/22 92     General:   Well developed, well groomed.    Head/Neck:   No obvious jugular venous distention     No obvious carotid pulsations.      No evidence of xanthelasma.  Lungs:   No respiratory distress.      Good respiratory effort  Heart:  No obvious pulsations  Abdomen:   No guarding  Extremities:   Well perfused    No significant edema.    Neurological:   Alert and oriented to person, place, time.      No focal neurological deficit visually.  Skin:   No obvious rash

## 2023-05-07 ENCOUNTER — Inpatient Hospital Stay
Admit: 2023-05-07 | Discharge: 2023-05-08 | Disposition: A | Discharge status: Other Acute Inpatient Hospital | Payer: BLUE CROSS/BLUE SHIELD | Attending: Emergency Medicine

## 2023-05-07 DIAGNOSIS — T50902A Poisoning by unspecified drugs, medicaments and biological substances, intentional self-harm, initial encounter: Secondary | ICD-10-CM

## 2023-05-07 DIAGNOSIS — E876 Hypokalemia: Secondary | ICD-10-CM

## 2023-05-07 LAB — CBC WITH AUTO DIFFERENTIAL
Basophils %: 0.7 % (ref 0.0–2.0)
Basophils Absolute: 0.09 10*3/uL (ref 0.00–0.10)
Eosinophils %: 5.4 % — ABNORMAL HIGH (ref 0.0–5.0)
Eosinophils Absolute: 0.69 10*3/uL — ABNORMAL HIGH (ref 0.00–0.40)
Hematocrit: 48.1 % — ABNORMAL HIGH (ref 36.0–48.0)
Hemoglobin: 16.4 g/dL — ABNORMAL HIGH (ref 13.0–16.0)
Immature Granulocytes %: 0.8 % — ABNORMAL HIGH (ref 0.0–0.5)
Immature Granulocytes Absolute: 0.1 10*3/uL — ABNORMAL HIGH (ref 0.00–0.04)
Lymphocytes %: 26.1 % (ref 21.0–52.0)
Lymphocytes Absolute: 3.31 10*3/uL (ref 0.90–3.60)
MCH: 30.9 pg (ref 24.0–34.0)
MCHC: 34.1 g/dL (ref 31.0–37.0)
MCV: 90.8 fL (ref 78.0–100.0)
MPV: 9.8 fL (ref 9.2–11.8)
Monocytes %: 6.2 % (ref 3.0–10.0)
Monocytes Absolute: 0.79 10*3/uL (ref 0.05–1.20)
Neutrophils %: 60.8 % (ref 40.0–73.0)
Neutrophils Absolute: 7.69 10*3/uL (ref 1.80–8.00)
Nucleated RBCs: 0 /100{WBCs}
Platelets: 263 10*3/uL (ref 135–420)
RBC: 5.3 M/uL (ref 4.35–5.65)
RDW: 13.3 % (ref 11.6–14.5)
WBC: 12.7 10*3/uL (ref 4.6–13.2)
nRBC: 0 10*3/uL (ref 0.00–0.01)

## 2023-05-07 LAB — EKG 12-LEAD
Atrial Rate: 93 {beats}/min
Atrial Rate: 73 {beats}/min
Diagnosis: NORMAL
Diagnosis: NORMAL
P Axis: 49 degrees
P Axis: 37 degrees
P-R Interval: 166 ms
P-R Interval: 192 ms
Q-T Interval: 392 ms
Q-T Interval: 446 ms
QRS Duration: 108 ms
QRS Duration: 114 ms
QTc Calculation (Bazett): 487 ms
QTc Calculation (Bazett): 491 ms
R Axis: -29 degrees
R Axis: -28 degrees
T Axis: 17 degrees
T Axis: -3 degrees
Ventricular Rate: 93 {beats}/min
Ventricular Rate: 73 {beats}/min

## 2023-05-07 LAB — URINE DRUG SCREEN
Amphetamine, Urine: NEGATIVE
Barbiturates, Urine: NEGATIVE
Benzodiazepines, Urine: NEGATIVE
Cocaine, Urine: NEGATIVE
Methadone, Urine: NEGATIVE
Opiates, Urine: NEGATIVE
Phencyclidine, Urine: NEGATIVE
THC, TH-Cannabinol, Urine: NEGATIVE

## 2023-05-07 LAB — ETHANOL: Ethanol Lvl: <3 mg/dL (ref 0–3)

## 2023-05-07 LAB — PROTIME-INR
INR: 0.9 (ref 0.9–1.1)
Protime: 12.5 s (ref 11.9–14.9)

## 2023-05-07 LAB — COMPREHENSIVE METABOLIC PANEL
ALT: 37 U/L (ref 16–61)
AST: 22 U/L (ref 10–38)
Albumin/Globulin Ratio: 0.7 — ABNORMAL LOW (ref 0.8–1.7)
Albumin: 3.6 g/dL (ref 3.4–5.0)
Alk Phosphatase: 117 U/L (ref 45–117)
Anion Gap: 6 mmol/L (ref 3.0–18)
BUN/Creatinine Ratio: 10 — ABNORMAL LOW (ref 12–20)
BUN: 13 mg/dL (ref 7.0–18)
CO2: 25 mmol/L (ref 21–32)
Calcium: 9.1 mg/dL (ref 8.5–10.1)
Chloride: 106 mmol/L (ref 100–111)
Creatinine: 1.29 mg/dL (ref 0.6–1.3)
Est, Glom Filt Rate: 67 mL/min/{1.73_m2} (ref >60)
Globulin: 5.1 g/dL — ABNORMAL HIGH (ref 2.0–4.0)
Glucose: 151 mg/dL — ABNORMAL HIGH (ref 74–99)
Potassium: 3.2 mmol/L — ABNORMAL LOW (ref 3.5–5.5)
Sodium: 137 mmol/L (ref 136–145)
Total Bilirubin: 0.4 mg/dL (ref 0.2–1.0)
Total Protein: 8.7 g/dL — ABNORMAL HIGH (ref 6.4–8.2)

## 2023-05-07 LAB — ACETAMINOPHEN LEVEL: Acetaminophen Level: <2 ug/mL — ABNORMAL LOW (ref 10.0–30.0)

## 2023-05-07 LAB — MAGNESIUM: Magnesium: 2.2 mg/dL (ref 1.6–2.6)

## 2023-05-07 LAB — SALICYLATE LEVEL: Salicylate Lvl: <1.7 mg/dL — ABNORMAL LOW (ref 2.8–20.0)

## 2023-05-07 LAB — CK: Total CK: 84 U/L (ref 39–308)

## 2023-05-07 MED ORDER — MAGNESIUM SULFATE IN D5W 1-5 GM/100ML-% IV SOLN
1-5100- | INTRAVENOUS | Status: AC
Start: 2023-05-07 — End: 2023-05-07
  Administered 2023-05-07: 10:00:00 1000 mg via INTRAVENOUS

## 2023-05-07 MED ORDER — POTASSIUM CHLORIDE CRYS ER 20 MEQ PO TBCR
20 | ORAL | Status: AC
Start: 2023-05-07 — End: 2023-05-07
  Administered 2023-05-07: 10:00:00 40 meq via ORAL

## 2023-05-07 MED ORDER — POTASSIUM CHLORIDE 10 MEQ/100ML IV SOLN
10100 | INTRAVENOUS | Status: AC
Start: 2023-05-07 — End: 2023-05-07
  Administered 2023-05-07 (×3): 10 meq via INTRAVENOUS

## 2023-05-07 MED FILL — POTASSIUM CHLORIDE CRYS ER 20 MEQ PO TBCR: 20 MEQ | ORAL | Qty: 2

## 2023-05-07 MED FILL — POTASSIUM CHLORIDE 10 MEQ/100ML IV SOLN: 10100 MEQ/0ML | INTRAVENOUS | Qty: 100

## 2023-05-07 MED FILL — MAGNESIUM SULFATE IN D5W 1-5 GM/100ML-% IV SOLN: 1-5100- GM/100ML-% | INTRAVENOUS | Qty: 100

## 2023-05-07 NOTE — ED Notes (Signed)
Per [employer] control, patient should be observed for 6 hours from ingestion of medication.

## 2023-05-07 NOTE — ED Notes (Signed)
Transfer of patient care report given to Kathie Rhodes  RN (oncoming nurse) by Everardo Beals (off going nurse) at bedside.

## 2023-05-07 NOTE — ED Provider Notes (Signed)
 EMERGENCY DEPARTMENT HISTORY AND PHYSICAL EXAM      Date: 05/07/2023  Patient Name: Herbert Burns    History of Presenting Illness     Chief Complaint   Patient presents with    Suicide Attempt       History (Context): Herbert Burns is a 53 y.o. male  presents to the ED today with chief complaint of intentional overdose.  Patient comes to the emergency department after stating he took "the rest of my lamotrigine."  I asked him if he did this in attempt to hurt himself and he states that he does not know.  I asked him if he accidentally took the medication and he states "will I took the whole bottle."  He denies any suicidal ideations at this time and homicidal ideations.  He denies any physical complaints in addition.      PCP: Sena Slate, MD    Current Facility-Administered Medications   Medication Dose Route Frequency Provider Last Rate Last Admin    potassium chloride 10 mEq/100 mL IVPB (Peripheral Line)  10 mEq IntraVENous Q1H Wendi Snipes., DO 100 mL/hr at 05/07/23 0519 10 mEq at 05/07/23 1610     Current Outpatient Medications   Medication Sig Dispense Refill    allopurinol (ZYLOPRIM) 100 MG tablet Take 1 tablet by mouth Every Day      amLODIPine (NORVASC) 10 MG tablet       aspirin 81 MG EC tablet Take 1 tablet by mouth Every Day      losartan (COZAAR) 100 MG tablet 1 tablet Orally once at night for 90 days      metFORMIN (GLUCOPHAGE-XR) 500 MG extended release tablet Take 1 tablet by mouth Every Day         Past History     Past Medical History:   Past Medical History:   Diagnosis Date    Bipolar disorder (HCC)     Hypertension     Personal history of kidney cancer        Past Surgical History:  Past Surgical History:   Procedure Laterality Date    KIDNEY REMOVAL Left     partial       Family History:  No family history on file.    Social History:   Social History     Tobacco Use    Smoking status: Never    Smokeless tobacco: Never   Vaping Use    Vaping status: Never Used   Substance Use Topics     Alcohol use: Yes     Comment: once a couple of months    Drug use: Never       Allergies:  No Known Allergies      Physical Exam     Vitals:    05/07/23 0253 05/07/23 0500   BP: (!) 211/140 (!) 169/100   Pulse: 99 91   Resp: 16 19   Temp: 98.4 F (36.9 C) 98.4 F (36.9 C)   TempSrc: Oral Oral   SpO2: 97% 93%   Weight: 120.7 kg (266 lb)    Height: 1.715 m (5' 7.5")        Physical Exam  Constitutional:       General: He is not in acute distress.     Appearance: He is not ill-appearing or toxic-appearing.   HENT:      Head: Normocephalic and atraumatic.   Eyes:      General: No scleral icterus.  Cardiovascular:  Rate and Rhythm: Normal rate and regular rhythm.      Pulses: Normal pulses.   Pulmonary:      Effort: Pulmonary effort is normal. No respiratory distress.   Abdominal:      General: Abdomen is flat.      Palpations: Abdomen is soft.      Tenderness: There is no abdominal tenderness. There is no guarding or rebound.   Musculoskeletal:         General: No deformity. Normal range of motion.      Cervical back: Normal range of motion and neck supple.      Right lower leg: No edema.      Left lower leg: No edema.   Skin:     General: Skin is warm and dry.   Neurological:      General: No focal deficit present.      Mental Status: He is alert and oriented to person, place, and time. Mental status is at baseline.   Psychiatric:         Behavior: Behavior normal.         Diagnostic Study Results     Labs -     Recent Results (from the past 12 hour(s))   EKG 12 Lead (Abn HR)    Collection Time: 05/07/23  3:01 AM   Result Value Ref Range    Ventricular Rate 93 BPM    Atrial Rate 93 BPM    P-R Interval 166 ms    QRS Duration 108 ms    Q-T Interval 392 ms    QTc Calculation (Bazett) 487 ms    P Axis 49 degrees    R Axis -29 degrees    T Axis 17 degrees    Diagnosis       Normal sinus rhythm  Voltage criteria for left ventricular hypertrophy  Prolonged QT  Abnormal ECG  When compared with ECG of 17-Jan-2023  09:44,  Nonspecific T wave abnormality has replaced inverted T waves in Inferior   leads     CBC with Auto Differential    Collection Time: 05/07/23  3:03 AM   Result Value Ref Range    WBC 12.7 4.6 - 13.2 K/uL    RBC 5.30 4.35 - 5.65 M/uL    Hemoglobin 16.4 (H) 13.0 - 16.0 g/dL    Hematocrit 28.3 (H) 36.0 - 48.0 %    MCV 90.8 78.0 - 100.0 FL    MCH 30.9 24.0 - 34.0 PG    MCHC 34.1 31.0 - 37.0 g/dL    RDW 15.1 76.1 - 60.7 %    Platelets 263 135 - 420 K/uL    MPV 9.8 9.2 - 11.8 FL    Nucleated RBCs 0.0 0 PER 100 WBC    nRBC 0.00 0.00 - 0.01 K/uL    Neutrophils % 60.8 40.0 - 73.0 %    Lymphocytes % 26.1 21.0 - 52.0 %    Monocytes % 6.2 3.0 - 10.0 %    Eosinophils % 5.4 (H) 0.0 - 5.0 %    Basophils % 0.7 0.0 - 2.0 %    Immature Granulocytes % 0.8 (H) 0.0 - 0.5 %    Neutrophils Absolute 7.69 1.80 - 8.00 K/UL    Lymphocytes Absolute 3.31 0.90 - 3.60 K/UL    Monocytes Absolute 0.79 0.05 - 1.20 K/UL    Eosinophils Absolute 0.69 (H) 0.00 - 0.40 K/UL    Basophils Absolute 0.09 0.00 - 0.10 K/UL    Immature  Granulocytes Absolute 0.10 (H) 0.00 - 0.04 K/UL    Differential Type AUTOMATED     CMP    Collection Time: 05/07/23  3:03 AM   Result Value Ref Range    Sodium 137 136 - 145 mmol/L    Potassium 3.2 (L) 3.5 - 5.5 mmol/L    Chloride 106 100 - 111 mmol/L    CO2 25 21 - 32 mmol/L    Anion Gap 6 3.0 - 18 mmol/L    Glucose 151 (H) 74 - 99 mg/dL    BUN 13 7.0 - 18 MG/DL    Creatinine 2.59 0.6 - 1.3 MG/DL    BUN/Creatinine Ratio 10 (L) 12 - 20      Est, Glom Filt Rate 67 >60 ml/min/1.49m2    Calcium 9.1 8.5 - 10.1 MG/DL    Total Bilirubin 0.4 0.2 - 1.0 MG/DL    ALT 37 16 - 61 U/L    AST 22 10 - 38 U/L    Alk Phosphatase 117 45 - 117 U/L    Total Protein 8.7 (H) 6.4 - 8.2 g/dL    Albumin 3.6 3.4 - 5.0 g/dL    Globulin 5.1 (H) 2.0 - 4.0 g/dL    Albumin/Globulin Ratio 0.7 (L) 0.8 - 1.7     Protime-INR    Collection Time: 05/07/23  3:03 AM   Result Value Ref Range    Protime 12.5 11.9 - 14.9 sec    INR 0.9 0.9 - 1.1     Magnesium     Collection Time: 05/07/23  3:03 AM   Result Value Ref Range    Magnesium 2.2 1.6 - 2.6 mg/dL   ETOH    Collection Time: 05/07/23  3:03 AM   Result Value Ref Range    Ethanol Lvl <3 0 - 3 MG/DL   CK    Collection Time: 05/07/23  3:03 AM   Result Value Ref Range    Total CK 84 39 - 308 U/L   Urine Drug Screen    Collection Time: 05/07/23  3:10 AM   Result Value Ref Range    Benzodiazepines, Urine Negative NEG      Barbiturates, Urine Negative NEG      THC, TH-Cannabinol, Urine Negative NEG      Opiates, Urine Negative NEG      Phencyclidine, Urine Negative NEG      Cocaine, Urine Negative NEG      Amphetamine, Urine Negative NEG      Methadone, Urine Negative NEG      Comments: (NOTE)    Acetaminophen Level    Collection Time: 05/07/23  5:00 AM   Result Value Ref Range    Acetaminophen Level <2 (L) 10.0 - 30.0 ug/mL   Salicylate    Collection Time: 05/07/23  5:00 AM   Result Value Ref Range    Salicylate Lvl <1.7 (L) 2.8 - 20.0 MG/DL      Labs Reviewed   CBC WITH AUTO DIFFERENTIAL - Abnormal; Notable for the following components:       Result Value    Hemoglobin 16.4 (*)     Hematocrit 48.1 (*)     Eosinophils % 5.4 (*)     Immature Granulocytes % 0.8 (*)     Eosinophils Absolute 0.69 (*)     Immature Granulocytes Absolute 0.10 (*)     All other components within normal limits   COMPREHENSIVE METABOLIC PANEL - Abnormal; Notable for the following components:    Potassium  3.2 (*)     Glucose 151 (*)     BUN/Creatinine Ratio 10 (*)     Total Protein 8.7 (*)     Globulin 5.1 (*)     Albumin/Globulin Ratio 0.7 (*)     All other components within normal limits   ACETAMINOPHEN LEVEL - Abnormal; Notable for the following components:    Acetaminophen Level <2 (*)     All other components within normal limits   SALICYLATE LEVEL - Abnormal; Notable for the following components:    Salicylate Lvl <1.7 (*)     All other components within normal limits   PROTIME-INR   MAGNESIUM   URINE DRUG SCREEN   ETHANOL   CK   SALICYLATE LEVEL    ACETAMINOPHEN LEVEL       Radiologic Studies -   No orders to display         The laboratory results, imaging results, and other diagnostic exams were reviewed in the EMR.    Medical Decision Making   I am the first provider for this patient.    I reviewed the vital signs, available nursing notes, past medical history, past surgical history, family history and social history.    Vital Signs-Reviewed the patient's vital signs.    ED EKG interpretation:  Rhythm: normal sinus rhythm; and regular . Rate (approx.): 93; Axis: left axis deviation; P wave: normal; QRS interval: normal ; ST/T wave: non-specific changes; Other findings: abnormal ekg. This EKG was interpreted by Christian Mate, D.O.         Records Reviewed: Personally, on initial evaluation    MDM:   DDX includes but is not limited to: Suicide attempt, arrhythmia, overdose, seizures, electrolyte abnormality, psychiatric disorder    Patient overall in no acute distress and vital signs significant for hypertension but otherwise grossly within normal limits.  Will contact poison control at this time.  Will obtain lab work for further evaluation of his symptoms.  Will continue to monitor and evaluate patient while in the emergency department.      Orders as below:  Orders Placed This Encounter   Procedures    CBC with Auto Differential    CMP    Protime-INR    Magnesium    Urine Drug Screen    ETOH    Salicylate    CK    Acetaminophen Level    Acetaminophen Level    Salicylate    [facility] Consult to Tele-Psych    EKG 12 Lead (Abn HR)          ED Course:   ED Course as of 05/07/23 0619   Mon May 07, 2023   0300 Spoke with poison control who states concern for nausea, vomiting, tachycardia, and rhabdo with potential seizures with overdose.  Recommended benzodiazepines for seizure-like activity.  Also recommending calcium, magnesium, and potassium to be repleted on the high end of normal.  Recommended against QTc prolonging medication.  Will continue to follow.  [DV]   [phone] Patient's lab work significant for potassium 3.2, otherwise labs grossly within normal limits.  Will optimize patient's potassium and magnesium.  Will continue to monitor patient. [DV]   0359 UDS negative. [DV]   [phone] Spoke with telepsychiatry about possible need for escalation in inpatient admission.  Recommends inpatient psychiatric treatment pending medical clearance.  Per bedside nurse spoke with poison control who recommended observation for 6 hours from time of ingestion..  Patient needs to be monitored for 30 more minutes from this  time for medical clearance.  Will continue to monitor patient. [DV]   [phone] Patient medically cleared at this time. [DV]      ED Course User Index  [DV] Wendi Snipes., DO     6:19 AM : Pt care transferred to Dr. Thelma Comp  ,ED provider. History of patient complaint(s), available diagnostic reports and current treatment plan has been discussed thoroughly.   Bedside rounding on patient occured : No  Intended disposition of patient : Placement  Pending diagnostics reports and/or labs (please list): Placement    Dr. Emi Holes assistance in completion of this plan is greatly appreciated but it should be noted that I will be the provider of record for this patient.        Is this patient to be included in the SEP-1 core measure?       Procedures:  Procedures      Rhythm interpretation from monitor: Sinus rhythm      Social Determinants of Health: none       Supplemental Historians include: Patient       Documentation/Prior Results Review:  Old medical records.  Previous electrocardiograms.  Nursing notes.      Discussion of Mangement with other Physicians, QHP or Appropriate Source:  Poison control, telepsych    Critical Care Time:  The services I provided to this patient were to treat and/or prevent clinically significant deterioration that could result in the failure of one or more body systems and/or organ systems due to Drug overdose/poisoning    Services included the  following:  -reviewing nursing notes and old charts  -vital sign assessments  -direct patient care  -medication orders and management  -re-evaluations  -documentation time    Aggregate critical care time was 33 minutes, which includes only time during which I was engaged in work directly related to the patient's care as described above, whether I was at bedside or elsewhere in the Emergency Department. It did not include time spent performing other reported procedures or the services of residents, students, or nurses and excluded separately billable procedure.    Christian Mate DO    6:19 AM        Diagnosis and Disposition     CLINICAL IMPRESSION:  1. Intentional overdose, initial encounter (HCC)    2. Suicide attempt (HCC)    3. Acute hypokalemia         Medication List        ASK your doctor about these medications      allopurinol 100 MG tablet  Commonly known as: ZYLOPRIM     amLODIPine 10 MG tablet  Commonly known as: NORVASC     aspirin 81 MG EC tablet     losartan 100 MG tablet  Commonly known as: COZAAR     metFORMIN 500 MG extended release tablet  Commonly known as: GLUCOPHAGE-XR              Disposition: TBD        Dragon Disclaimer     Please note that this dictation was completed with Dragon, the [employer] account planner.  Quite often unanticipated grammatical, syntax, homophones, and other interpretive errors are inadvertently transcribed by the computer software.  Please disregard these errors.  Please excuse any errors that have escaped final proofreading.      Christian Mate DO      Wendi Snipes., DO  05/07/23 [phone]

## 2023-05-07 NOTE — ED Notes (Signed)
Bedside report given to Asher Muir, Charity fundraiser.

## 2023-05-07 NOTE — Video Visit Notes (Signed)
Herbert Burns  [id number]  1970/09/05     Social Work Behavioral Health Crisis Assessment    05/07/23    Chief Complaint: suicidal with overdose of medication attempt; Per chart, "Pt arrives ambulatory to triage reports taking the rest of the bottle of lamotrigine, 2 & 1/2 hours ago. Pt very passive about intent of trying to hurt himself, reports to RN "I guess it was just a spur of the moment thing"     HPI: Patient is a 53 y.o. Black / [place] American male who presents for psych eval. Patient presented to the ED on 05/07/23 from home. Per chart, "52 y.o. male presents to the ED today with chief complaint of intentional overdose. Patient comes to the emergency department after stating he took "the rest of my lamotrigine." I asked him if he did this in attempt to hurt himself and he states that he does not know. I asked him if he accidentally took the medication and he states "well I took the whole bottle." He denies any suicidal ideations at this time and homicidal ideations. He denies any physical complaints in addition."      Past Psychiatric History:  Previous Diagnoses/symptoms: bipolar  Previous suicide attempts/self-harm: yes states 1x, drove self to hospital; his sister reports he attempted also at age 74.  Inpatient psychiatric hospitalizations: denies  Current outpatient psychiatric provider: yes  Dr. 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; takes turns with sister, roommate - great  Marital/Committed relationship and parenting hx: single, no kids; asexual  Occupation: IT Education officer, community History/Hx of Violence: Denies  Spiritual History: atheist  Psychological trauma, neglect, or abuse: denies hx of trauma/abuse , states had a very happy childhood  Access to guns or other weapons: denies having access to [employer]     Past Medical History:  Active Ambulatory Problems     Diagnosis Date Noted    No Active Ambulatory Problems     Resolved Ambulatory Problems     Diagnosis Date Noted    No Resolved Ambulatory Problems     Past Medical History:   Diagnosis Date    Bipolar disorder (HCC)     Hypertension     Personal history of kidney cancer      Allergies:  No Known Allergies   Medications:    Current Facility-Administered Medications:     potassium chloride (KLOR-CON M) extended release tablet 40 mEq, 40 mEq, Oral, NOW, Herbert Loh  Jr., DO    magnesium sulfate 1000 mg in dextrose 5% 100 mL IVPB, 1,000 mg, IntraVENous, NOW, Vennard, Carie Caddy., DO    potassium chloride 10 mEq/100 mL IVPB (Peripheral Line), 10 mEq, IntraVENous, Q1H, Vennard, Carie Caddy., DO    Current Outpatient Medications:     allopurinol (ZYLOPRIM) 100 MG tablet, Take 1 tablet by mouth Every Day, Disp: , Rfl:     amLODIPine (NORVASC) 10 MG tablet, , Disp: , Rfl:     aspirin 81 MG EC tablet, Take 1 tablet by mouth Every Day, Disp: , Rfl:     losartan (COZAAR) 100 MG tablet, 1 tablet Orally once at  night for 90 days, Disp: , Rfl:     metFORMIN (GLUCOPHAGE-XR) 500 MG extended release tablet, Take 1 tablet by mouth Every Day, Disp: , Rfl:   Not in a hospital admission.  Prior to Admission medications    Medication Sig Start Date End Date Taking? Authorizing Provider   allopurinol (ZYLOPRIM) 100 MG tablet Take 1 tablet by mouth Every Day 08/30/22   [provider]   amLODIPine (NORVASC) 10 MG tablet  10/04/22   [provider]   aspirin 81 MG EC tablet Take 1 tablet by mouth Every Day 08/30/22   [provider]   losartan (COZAAR) 100 MG tablet 1 tablet Orally once at night for 90 days    [provider]   metFORMIN (GLUCOPHAGE-XR) 500 MG extended release tablet Take 1 tablet by mouth Every Day 08/30/22   [provider]        Labs:  UDS: negative  ETOH: negative      Mental Status Exam:  Level of consciousness:  within normal limits   Appearance:  lying in bed and good grooming.  Does appear stated age. No acute distress.  Behavior/Motor:  no abnormalities noted  Attitude toward examiner:  cooperative, attentive, poor eye contact, and evasive  SI/HI:Denies SI/HI  Speech:  normal rate and normal volume , Tone: normal tone  Mood: "worried"  Affect: blunted  Thought Processes:  coherent.   Thought Content: states feels paranoid  Hallucinations:  Hallucinations: Denies AVOT-H, states not for awhile, states was misdiagnosed & prescribed SSRI, used to hear things when taking anti-depressant without mood stabilizer, states made him happy, elevated, hypomanic for a long time woke up happy & singing  Cognition:  oriented to person, place, and time   Concentration: intact  Memory: intact, though not formally tested.  Insight: poor   Judgement: poor   Fund of Knowledge: limited    Overall Level Suicide Risk: TelePsych CSSRS Risk Level: High Risk  CSSR-S Screening: high risk    Brief ClinicalSummary:   Patient is a 53 y.o. African American male who presents for psych eval. Patient  attempted to overdose on medication, called his sister & told he he was scared & thought he was dying & hung up. She then went to their mother's home. Their mother has alzheimer's & they take turns staying / caring for her. His sister called around & found out that he had drove himself to the hospital. She reports that he has attempted once before at age 41. She reported when she had talked to him earlier & he told her he felt, "blah." She said that she just thought he was tired. She found an empty bottle of the lamotrigine (filled in Dec. 2024, 90 day supply takes 3 pills at a time daily) sitting on the kitchen table along  with 5 other medications in his bedroom. 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 Patient denies current SI & minimizes. He did not report any other attempts & denied HI & AVH. He reports seeing a psychiatrist, Dr. Clayburn Pert via [facility] & in the past a therapist 5 yrs ago. He is recommended inpatient psych admission. He meets criteria & is a danger to himself. He will benefit from treatment.        Dx:   bipolar    Plan:  Collateral: spoke with by phone sister Caliber Landess [phone]  Inpatient psychiatric admission at appropriate care level facility, once medically cleared and stable  Safety plan created and reviewed with patient, see below for details  Re-consult for any new changes or concerns. Thank you for this consult.  Discussed recommendations with Dr. Anastasia Pall at time of consult completion.    TelePsych recommendations:Inpatient psychiatric admission      Safety Plan:  updated        Electronically signed by Steele Berg, LISW on 05/07/2023 at 4:09 AM.      Vella Redhead, was evaluated through a synchronous (real-time) audio-video encounter. The patient (and/or guardian if applicable) is aware that this is a billable service, which includes applicable co-pays. This virtual visit was conducted with patient's (and/or legal guardian's) consent.  Patient identification was verified, and a caregiver was present when appropriate.  The patient was located at Facility (Appt Department): [facility]  [facility] EMERGENCY DEPARTMENT  [address]  [place] [address]  Loc: [phone]  The provider was located at Home (City/State): [place], [place]  Confirm you are appropriately licensed, registered, or certified to deliver care in the state where the patient is located as indicated above. If you are not or unsure, please re-schedule the visit: Yes, I confirm.   [facility] Consult to [employer]  Consult performed by: Mariel Kansky  Consult ordered by: Wendi Snipes., DO  Reason for consult: suicidal with attempt           Total time spent on this encounter: Not billed by time    --Steele Berg, LISW on 05/07/2023 at 4:09 AM    An electronic signature was used to authenticate this note.

## 2023-05-07 NOTE — ED Notes (Signed)
I received the patient in turnover from Dr. Tamsen Snider at the end of his shift.  The patient is a 53 year old male who tried to overdose on Lamictal today.  He is medically cleared and awaiting placement at this time.    Recent Results (from the past 24 hour(s))   EKG 12 Lead (Abn HR)    Collection Time: 05/07/23  3:01 AM   Result Value Ref Range    Ventricular Rate 93 BPM    Atrial Rate 93 BPM    P-R Interval 166 ms    QRS Duration 108 ms    Q-T Interval 392 ms    QTc Calculation (Bazett) 487 ms    P Axis 49 degrees    R Axis -29 degrees    T Axis 17 degrees    Diagnosis       Normal sinus rhythm  Voltage criteria for left ventricular hypertrophy  Prolonged QT  Abnormal ECG  When compared with ECG of 17-Jan-2023 09:44,  Nonspecific T wave abnormality has replaced inverted T waves in Inferior   leads  Confirmed by Zoila Shutter, MD, Marc [phone]) on 05/07/2023 1:43:00 PM     CBC with Auto Differential    Collection Time: 05/07/23  3:03 AM   Result Value Ref Range    WBC 12.7 4.6 - 13.2 K/uL    RBC 5.30 4.35 - 5.65 M/uL    Hemoglobin 16.4 (H) 13.0 - 16.0 g/dL    Hematocrit 96.0 (H) 36.0 - 48.0 %    MCV 90.8 78.0 - 100.0 FL    MCH 30.9 24.0 - 34.0 PG    MCHC 34.1 31.0 - 37.0 g/dL    RDW 45.4 09.8 - 11.9 %    Platelets 263 135 - 420 K/uL    MPV 9.8 9.2 - 11.8 FL    Nucleated RBCs 0.0 0 PER 100 WBC    nRBC 0.00 0.00 - 0.01 K/uL    Neutrophils % 60.8 40.0 - 73.0 %    Lymphocytes % 26.1 21.0 - 52.0 %    Monocytes % 6.2 3.0 - 10.0 %    Eosinophils % 5.4 (H) 0.0 - 5.0 %    Basophils % 0.7 0.0 - 2.0 %    Immature Granulocytes % 0.8 (H) 0.0 - 0.5 %    Neutrophils Absolute 7.69 1.80 - 8.00 K/UL    Lymphocytes Absolute 3.31 0.90 - 3.60 K/UL    Monocytes Absolute 0.79 0.05 - 1.20 K/UL    Eosinophils Absolute 0.69 (H) 0.00 - 0.40 K/UL    Basophils Absolute 0.09 0.00 - 0.10 K/UL    Immature Granulocytes Absolute 0.10 (H) 0.00 - 0.04 K/UL    Differential Type AUTOMATED     CMP    Collection Time: 05/07/23  3:03 AM   Result Value Ref Range     Sodium 137 136 - 145 mmol/L    Potassium 3.2 (L) 3.5 - 5.5 mmol/L    Chloride 106 100 - 111 mmol/L    CO2 25 21 - 32 mmol/L    Anion Gap 6 3.0 - 18 mmol/L    Glucose 151 (H) 74 - 99 mg/dL    BUN 13 7.0 - 18 MG/DL    Creatinine 1.47 0.6 - 1.3 MG/DL    BUN/Creatinine Ratio 10 (L) 12 - 20      Est, Glom Filt Rate 67 >60 ml/min/1.62m2    Calcium 9.1 8.5 - 10.1 MG/DL    Total Bilirubin 0.4 0.2 - 1.0 MG/DL  ALT 37 16 - 61 U/L    AST 22 10 - 38 U/L    Alk Phosphatase 117 45 - 117 U/L    Total Protein 8.7 (H) 6.4 - 8.2 g/dL    Albumin 3.6 3.4 - 5.0 g/dL    Globulin 5.1 (H) 2.0 - 4.0 g/dL    Albumin/Globulin Ratio 0.7 (L) 0.8 - 1.7     Protime-INR    Collection Time: 05/07/23  3:03 AM   Result Value Ref Range    Protime 12.5 11.9 - 14.9 sec    INR 0.9 0.9 - 1.1     Magnesium    Collection Time: 05/07/23  3:03 AM   Result Value Ref Range    Magnesium 2.2 1.6 - 2.6 mg/dL   ETOH    Collection Time: 05/07/23  3:03 AM   Result Value Ref Range    Ethanol Lvl <3 0 - 3 MG/DL   CK    Collection Time: 05/07/23  3:03 AM   Result Value Ref Range    Total CK 84 39 - 308 U/L   Urine Drug Screen    Collection Time: 05/07/23  3:10 AM   Result Value Ref Range    Benzodiazepines, Urine Negative NEG      Barbiturates, Urine Negative NEG      THC, TH-Cannabinol, Urine Negative NEG      Opiates, Urine Negative NEG      Phencyclidine, Urine Negative NEG      Cocaine, Urine Negative NEG      Amphetamine, Urine Negative NEG      Methadone, Urine Negative NEG      Comments: (NOTE)    Acetaminophen Level    Collection Time: 05/07/23  5:00 AM   Result Value Ref Range    Acetaminophen Level <2 (L) 10.0 - 30.0 ug/mL   Salicylate    Collection Time: 05/07/23  5:00 AM   Result Value Ref Range    Salicylate Lvl <1.7 (L) 2.8 - 20.0 MG/DL   EKG 12 Lead    Collection Time: 05/07/23  9:49 AM   Result Value Ref Range    Ventricular Rate 73 BPM    Atrial Rate 73 BPM    P-R Interval 192 ms    QRS Duration 114 ms    Q-T Interval 446 ms    QTc Calculation  (Bazett) 491 ms    P Axis 37 degrees    R Axis -28 degrees    T Axis -3 degrees    Diagnosis       Normal sinus rhythm  Nonspecific intraventricular conduction delay  Voltage criteria for left ventricular hypertrophy  Cannot exclude anterior MI versus lead placement issue.  Abnormal ECG  When compared with ECG of 07-May-2023 03:01,  No significant change was found  Confirmed by Zoila Shutter, MD, Marc [phone]) on 05/07/2023 4:22:58 PM        No orders to display          Vista Lawman, MD  05/08/23 1046

## 2023-05-07 NOTE — ED Notes (Signed)
Pt's BP still elevated after getting his usual home meds. Pt states he also takes clonidine but does not know the dose.     Spoke with Dr. Lawerance Bach and she will put in order for clonidine.

## 2023-05-07 NOTE — ED Notes (Signed)
Pt given breakfast tray. Pt eating at this time and provided a water.

## 2023-05-07 NOTE — ED Notes (Signed)
Transfer Center Handoff for Behavioral Health Transfers      Patient's Current Location: [facility] EMERGENCY DEPARTMENT     Chief Complaint   Patient presents with    Suicide Attempt       Current or History of Violent Behavior: No    Currently in Restraints Now or During this Encounter: No  (Specify if Agitation or self harm is noted in ED?)  If yes, please describe behaviors requiring restraint:             Medical Clearance Documented and Verified in the Chart: Yes    No Known Allergies     Can Patient Tolerate Lying Flat: Yes    Able to Perform ADLs:  Yes  (Specify if able to ambulate or uses any mobility devices such as cane or walker)  Activity:    Level of Assistance:    Assistive Device:    Miscellaneous Devices:      LABS    CBC:   Lab Results   Component Value Date/Time    WBC 12.7 05/07/2023 03:03 AM    RBC 5.30 05/07/2023 03:03 AM    HGB 16.4 05/07/2023 03:03 AM    HCT 48.1 05/07/2023 03:03 AM    MCV 90.8 05/07/2023 03:03 AM    MCH 30.9 05/07/2023 03:03 AM    MCHC 34.1 05/07/2023 03:03 AM    RDW 13.3 05/07/2023 03:03 AM    PLT 263 05/07/2023 03:03 AM    MPV 9.8 05/07/2023 03:03 AM     CMP:   Lab Results   Component Value Date/Time    NA 137 05/07/2023 03:03 AM    K 3.2 05/07/2023 03:03 AM    CL 106 05/07/2023 03:03 AM    CO2 25 05/07/2023 03:03 AM    BUN 13 05/07/2023 03:03 AM    CREATININE 1.29 05/07/2023 03:03 AM    GFRAA >60 08/27/2020 12:18 PM    LABGLOM 67 05/07/2023 03:03 AM    GLUCOSE 151 05/07/2023 03:03 AM    CALCIUM 9.1 05/07/2023 03:03 AM    BILITOT 0.4 05/07/2023 03:03 AM    ALKPHOS 117 05/07/2023 03:03 AM    AST 22 05/07/2023 03:03 AM    ALT 37 05/07/2023 03:03 AM     Drug Panel:   Lab Results   Component Value Date/Time    AMPHETAMUR Negative 05/07/2023 03:10 AM    BARBITURATUR Negative 05/07/2023 03:10 AM    COCAINEUR Negative 05/07/2023 03:10 AM    METHADU Negative 05/07/2023 03:10 AM    OPIAU Negative 05/07/2023 03:10 AM    THCUR Negative 05/07/2023 03:10 AM    LABCOMM  (NOTE) 05/07/2023 03:10 AM     UA:  Lab Results   Component Value Date/Time    COLORU YELLOW 08/27/2020 12:55 PM    PROTEINU 30 08/27/2020 12:55 PM    GLUCOSEU Negative 08/27/2020 12:55 PM    KETUA Negative 08/27/2020 12:55 PM    BILIRUBINUR Negative 08/27/2020 12:55 PM    BLOODU Negative 08/27/2020 12:55 PM    NITRU Negative 08/27/2020 12:55 PM    LEUKOCYTESUR Negative 08/27/2020 12:55 PM    WBCUA NONE 08/27/2020 12:55 PM    RBCUA NONE 08/27/2020 12:55 PM    BACTERIA Negative 08/27/2020 12:55 PM     PREGNANCY TEST: No results found for: "PREGTESTUR"    Dialysis: No    Target Destination:     Insurance: Payor: [place] BCBS /  /  /      Current Psychotic Symptoms  Harmful Actions Towards Others: None Observed  Orientation Level:    Speech Pattern: Within Defined Limits  General Attitude: Within Defined Limits  Emotions: Within Defined Limits  Delusions: Within Defined Limits  Hallucination:  None    Any Suicidal Ideations: High Risk    Homicidal Ideations/Violence Risk:   Observed Violent Behavior:    Homicidal Ideations:      Past Psychiatric History:       Diagnosis Date    Bipolar disorder (HCC)     Hypertension     Personal history of kidney cancer        Hold (TDO/Involuntary Hold): No    The patient is currently receiving care for the above psychiatric illness.     Home Medications  Does Patient Have Medications to Bring to the Facility: No  Medications Prior to Admission:   Prior to Admission Medications   Prescriptions Last Dose Informant Patient Reported? Taking?   allopurinol (ZYLOPRIM) 100 MG tablet   Yes No   Sig: Take 1 tablet by mouth Every Day   amLODIPine (NORVASC) 10 MG tablet   Yes No   aspirin 81 MG EC tablet   Yes No   Sig: Take 1 tablet by mouth Every Day   losartan (COZAAR) 100 MG tablet   Yes No   Sig: 1 tablet Orally once at night for 90 days   metFORMIN (GLUCOPHAGE-XR) 500 MG extended release tablet   Yes No   Sig: Take 1 tablet by mouth Every Day      Facility-Administered Medications: None           Additional Information  Isolation:  none    HIV Positive: unknown    Oxygen Use: No    Any Legal Issues (Current or Past): unknown    Does Patient have POA or Guardian: No    Current Living Situation:  family    Roommate Appropriate: Yes    Is this a special case or have any other considerations:  No  (pregnancy, autism, developmental disabled, etc.)    Does the patient have confirmed or suspected COVID-19 Symptoms: No  (if yes, test must be completed, if no test is not required)       Last COVID Lab No results found for: "SARS-COV-2", "SARS-COV-2 RNA", "SARS-COV-2 RNA, RT PCR", "SARS-COV-2, NAA", "SARS-COV-2, NAAT", "SARS-COV-2 BY PCR", "RAPID", "SARS-COV-2, RAPID", "SALIVA", "SARS-COV-2, SALIVA"           Immunization status:   There is no immunization history on file for this patient.

## 2023-05-07 NOTE — ED Triage Notes (Signed)
Pt arrives ambulatory to triage reports taking the rest of the bottle of lamotrigine, 2 & 1/2 hours ago     Pt very passive about intent of trying to hurt himself, reports to RN "I guess it was just a spur of the moment thing"   Pt voluntary at this time, agreeing for a psych eval for [facility]

## 2023-05-07 NOTE — ED Notes (Signed)
3rd bag of potassium hung and infusing with NS.

## 2023-05-07 NOTE — ED Notes (Signed)
IV Mag finished and removed. RN aware.

## 2023-05-07 NOTE — Other (Signed)
Crisis Note: Patient presented to [facility]-ED with an intentional overdose on his medications. Patient verbalized that he took the medicine for his anxiety; however, patient is not willing for voluntary inpatient BH services. Patient stated. "I got to take care of my mom. I got to go home." Patient will be referred for TDO evaluation. Spoke with [place], [facility], re: need for TDO evaluation on patient. Clinicals and TDO petition submitted to [facility]. Dr. Lawerance Bach, ED-MD, made aware; awaiting response.

## 2023-05-07 NOTE — ED Notes (Signed)
Patients sister called

## 2023-05-07 NOTE — Other (Signed)
Crisis Note:  Arman Filter, ED-RN, who stated that she is in the process of getting patient's blood pressure medications and vitals will be re-assessed. Judeth Cornfield, [facility], [phone], made aware of the above; verbalized understanding.

## 2023-05-07 NOTE — ED Provider Notes (Addendum)
[facility] EMERGENCY DEPARTMENT  EMERGENCY DEPARTMENT ENCOUNTER     ED continued care note      Pt Name: Herbert Burns  MRN: [id number]  Birthdate 25-Jan-1971  Date of evaluation: 05/07/2023  Provider: Monico Hoar, MD   PCP: Sena Slate, MD  Note Started: 4:15 PM 05/07/23    Chief Complaint   Patient presents with    Suicide Attempt       3:53 PM EST  Signed out to me by Dr. Anastasia Pall.    Brief history: Suicide attempt with overdose of Lamictal  Pending: Patient is medically cleared and pending placement    Reviewed at 3:53 PM EST  Patient Vitals for the past 12 hrs:   Temp Pulse Resp BP SpO2   05/07/23 0800 -- 71 16 111/80 93 %   05/07/23 0730 -- 74 17 (!) 139/91 97 %   05/07/23 0700 -- 70 16 (!) 139/92 97 %   05/07/23 0630 -- 71 15 (!) 141/109 94 %   05/07/23 0600 -- 71 23 (!) 151/94 96 %   05/07/23 0530 -- 82 23 (!) 152/101 94 %   05/07/23 0500 98.4 F (36.9 C) 91 19 (!) 169/100 93 %   05/07/23 0430 -- 92 18 (!) 171/105 95 %       Clinical Impression:   1. Intentional overdose, initial encounter (HCC)    2. Suicide attempt (HCC)    3. Acute hypokalemia        PAST HISTORY     Past Medical History:  Past Medical History:   Diagnosis Date    Bipolar disorder (HCC)     Hypertension     Personal history of kidney cancer        Past Surgical History:  Past Surgical History:   Procedure Laterality Date    KIDNEY REMOVAL Left     partial       CURRENT MEDICATIONS      Previous Medications    ALLOPURINOL (ZYLOPRIM) 100 MG TABLET    Take 1 tablet by mouth Every Day    AMLODIPINE (NORVASC) 10 MG TABLET        ASPIRIN 81 MG EC TABLET    Take 1 tablet by mouth Every Day    LOSARTAN (COZAAR) 100 MG TABLET    1 tablet Orally once at night for 90 days    METFORMIN (GLUCOPHAGE-XR) 500 MG EXTENDED RELEASE TABLET    Take 1 tablet by mouth Every Day          DIAGNOSTIC RESULTS   LABS:     Recent Results (from the past 24 hour(s))   EKG 12 Lead (Abn HR)    Collection Time: 05/07/23  3:01 AM   Result Value Ref Range     Ventricular Rate 93 BPM    Atrial Rate 93 BPM    P-R Interval 166 ms    QRS Duration 108 ms    Q-T Interval 392 ms    QTc Calculation (Bazett) 487 ms    P Axis 49 degrees    R Axis -29 degrees    T Axis 17 degrees    Diagnosis       Normal sinus rhythm  Voltage criteria for left ventricular hypertrophy  Prolonged QT  Abnormal ECG  When compared with ECG of 17-Jan-2023 09:44,  Nonspecific T wave abnormality has replaced inverted T waves in Inferior   leads  Confirmed by Zoila Shutter, MD, Vernia Buff [phone]) on 05/07/2023 1:43:00 PM  CBC with Auto Differential    Collection Time: 05/07/23  3:03 AM   Result Value Ref Range    WBC 12.7 4.6 - 13.2 K/uL    RBC 5.30 4.35 - 5.65 M/uL    Hemoglobin 16.4 (H) 13.0 - 16.0 g/dL    Hematocrit 16.1 (H) 36.0 - 48.0 %    MCV 90.8 78.0 - 100.0 FL    MCH 30.9 24.0 - 34.0 PG    MCHC 34.1 31.0 - 37.0 g/dL    RDW 09.6 04.5 - 40.9 %    Platelets 263 135 - 420 K/uL    MPV 9.8 9.2 - 11.8 FL    Nucleated RBCs 0.0 0 PER 100 WBC    nRBC 0.00 0.00 - 0.01 K/uL    Neutrophils % 60.8 40.0 - 73.0 %    Lymphocytes % 26.1 21.0 - 52.0 %    Monocytes % 6.2 3.0 - 10.0 %    Eosinophils % 5.4 (H) 0.0 - 5.0 %    Basophils % 0.7 0.0 - 2.0 %    Immature Granulocytes % 0.8 (H) 0.0 - 0.5 %    Neutrophils Absolute 7.69 1.80 - 8.00 K/UL    Lymphocytes Absolute 3.31 0.90 - 3.60 K/UL    Monocytes Absolute 0.79 0.05 - 1.20 K/UL    Eosinophils Absolute 0.69 (H) 0.00 - 0.40 K/UL    Basophils Absolute 0.09 0.00 - 0.10 K/UL    Immature Granulocytes Absolute 0.10 (H) 0.00 - 0.04 K/UL    Differential Type AUTOMATED     CMP    Collection Time: 05/07/23  3:03 AM   Result Value Ref Range    Sodium 137 136 - 145 mmol/L    Potassium 3.2 (L) 3.5 - 5.5 mmol/L    Chloride 106 100 - 111 mmol/L    CO2 25 21 - 32 mmol/L    Anion Gap 6 3.0 - 18 mmol/L    Glucose 151 (H) 74 - 99 mg/dL    BUN 13 7.0 - 18 MG/DL    Creatinine 8.11 0.6 - 1.3 MG/DL    BUN/Creatinine Ratio 10 (L) 12 - 20      Est, Glom Filt Rate 67 >60 ml/min/1.21m2    Calcium 9.1  8.5 - 10.1 MG/DL    Total Bilirubin 0.4 0.2 - 1.0 MG/DL    ALT 37 16 - 61 U/L    AST 22 10 - 38 U/L    Alk Phosphatase 117 45 - 117 U/L    Total Protein 8.7 (H) 6.4 - 8.2 g/dL    Albumin 3.6 3.4 - 5.0 g/dL    Globulin 5.1 (H) 2.0 - 4.0 g/dL    Albumin/Globulin Ratio 0.7 (L) 0.8 - 1.7     Protime-INR    Collection Time: 05/07/23  3:03 AM   Result Value Ref Range    Protime 12.5 11.9 - 14.9 sec    INR 0.9 0.9 - 1.1     Magnesium    Collection Time: 05/07/23  3:03 AM   Result Value Ref Range    Magnesium 2.2 1.6 - 2.6 mg/dL   ETOH    Collection Time: 05/07/23  3:03 AM   Result Value Ref Range    Ethanol Lvl <3 0 - 3 MG/DL   CK    Collection Time: 05/07/23  3:03 AM   Result Value Ref Range    Total CK 84 39 - 308 U/L   Urine Drug Screen    Collection Time: 05/07/23  3:10 AM   Result Value Ref Range    Benzodiazepines, Urine Negative NEG      Barbiturates, Urine Negative NEG      THC, TH-Cannabinol, Urine Negative NEG      Opiates, Urine Negative NEG      Phencyclidine, Urine Negative NEG      Cocaine, Urine Negative NEG      Amphetamine, Urine Negative NEG      Methadone, Urine Negative NEG      Comments: (NOTE)    Acetaminophen Level    Collection Time: 05/07/23  5:00 AM   Result Value Ref Range    Acetaminophen Level <2 (L) 10.0 - 30.0 ug/mL   Salicylate    Collection Time: 05/07/23  5:00 AM   Result Value Ref Range    Salicylate Lvl <1.7 (L) 2.8 - 20.0 MG/DL   EKG 12 Lead    Collection Time: 05/07/23  9:49 AM   Result Value Ref Range    Ventricular Rate 73 BPM    Atrial Rate 73 BPM    P-R Interval 192 ms    QRS Duration 114 ms    Q-T Interval 446 ms    QTc Calculation (Bazett) 491 ms    P Axis 37 degrees    R Axis -28 degrees    T Axis -3 degrees    Diagnosis       Normal sinus rhythm  Incomplete right bundle branch block  Voltage criteria for left ventricular hypertrophy  Anterior infarct , age undetermined  Abnormal ECG  When compared with ECG of 07-May-2023 03:01,  No significant change was found     }        RADIOLOGY:     Interpretation per the Radiologist below, if available at the time of this note:     No orders to display           ED Course as of 05/07/23 1615   Mon May 07, 2023   0300 Spoke with poison control who states concern for nausea, vomiting, tachycardia, and rhabdo with potential seizures with overdose.  Recommended benzodiazepines for seizure-like activity.  Also recommending calcium, magnesium, and potassium to be repleted on the high end of normal.  Recommended against QTc prolonging medication.  Will continue to follow. [DV]   [phone] Patient's lab work significant for potassium 3.2, otherwise labs grossly within normal limits.  Will optimize patient's potassium and magnesium.  Will continue to monitor patient. [DV]   0359 UDS negative. [DV]   [phone] Spoke with telepsychiatry about possible need for escalation in inpatient admission.  Recommends inpatient psychiatric treatment pending medical clearance.  Per bedside nurse spoke with poison control who recommended observation for 6 hours from time of ingestion..  Patient needs to be monitored for 30 more minutes from this time for medical clearance.  Will continue to monitor patient. [DV]   [phone] Patient medically cleared at this time. [DV]   [phone] Signout:  SI attempt with lamotrigine.  Pending placement. [DE]   1014 EKG interpreted by me shows sinus rhythm with a rate of 73, normal PR and QRS, slightly prolonged QTc interval of 491 ms, left axis deviation, no acute ST segment or T wave changes. [DE]      ED Course User Index  [DE] Monico Hoar, MD  [DV] Wendi Snipes., DO         FINAL IMPRESSION     1. Intentional overdose, initial encounter (HCC)    2.  Suicide attempt (HCC)    3. Acute hypokalemia          DISPOSITION/PLAN   DISPOSITION [facility] Hold 05/07/2023 06:36:35 AM   DISPOSITION CONDITION Stable            DISCHARGE MEDICATIONS:     Medication List        ASK your doctor about these medications      allopurinol 100 MG  tablet  Commonly known as: ZYLOPRIM     amLODIPine 10 MG tablet  Commonly known as: NORVASC     aspirin 81 MG EC tablet     losartan 100 MG tablet  Commonly known as: COZAAR     metFORMIN 500 MG extended release tablet  Commonly known as: GLUCOPHAGE-XR                DISCONTINUED MEDICATIONS:  Current Discharge Medication List          Monico Hoar, MD (electronically signed)         Monico Hoar, MD  05/07/23 1553       Monico Hoar, MD  05/07/23 1615       Monico Hoar, MD  05/08/23 2005

## 2023-05-07 NOTE — ED Notes (Signed)
Please Notify sister Damauri Minion, of any changes or updates on patient     [phone]

## 2023-05-08 LAB — POCT GLUCOSE: POC Glucose: 143 mg/dL — ABNORMAL HIGH (ref 70–110)

## 2023-05-08 MED ORDER — METFORMIN HCL ER 500 MG PO TB24
500 | Freq: Every day | ORAL | Status: DC
Start: 2023-05-08 — End: 2023-05-08

## 2023-05-08 MED ORDER — ALLOPURINOL 100 MG PO TABS
100 | Freq: Every day | ORAL | Status: DC
Start: 2023-05-08 — End: 2023-05-08
  Administered 2023-05-08: 01:00:00 100 mg via ORAL

## 2023-05-08 MED ORDER — ASPIRIN 81 MG PO TBEC
81 | Freq: Every day | ORAL | Status: DC
Start: 2023-05-08 — End: 2023-05-08
  Administered 2023-05-08: 01:00:00 81 mg via ORAL

## 2023-05-08 MED ORDER — AMLODIPINE BESYLATE 10 MG PO TABS
10 | Freq: Every day | ORAL | Status: DC
Start: 2023-05-08 — End: 2023-05-08
  Administered 2023-05-08: 01:00:00 10 mg via ORAL

## 2023-05-08 MED ORDER — LOSARTAN POTASSIUM 50 MG PO TABS
50 | Freq: Every day | ORAL | Status: DC
Start: 2023-05-08 — End: 2023-05-08
  Administered 2023-05-08: 01:00:00 100 mg via ORAL

## 2023-05-08 MED ORDER — CLONIDINE HCL 0.1 MG PO TABS
0.1 | Freq: Once | ORAL | Status: AC
Start: 2023-05-08 — End: 2023-05-07
  Administered 2023-05-08: 04:00:00 0.2 mg via ORAL

## 2023-05-08 MED ORDER — INSULIN LISPRO 100 UNIT/ML IJ SOLN
100 | Freq: Four times a day (QID) | INTRAMUSCULAR | Status: DC
Start: 2023-05-08 — End: 2023-05-08

## 2023-05-08 MED FILL — AMLODIPINE BESYLATE 10 MG PO TABS: 10 MG | ORAL | Qty: 1

## 2023-05-08 MED FILL — CLONIDINE HCL 0.1 MG PO TABS: 0.1 MG | ORAL | Qty: 2

## 2023-05-08 MED FILL — BAYER LOW DOSE 81 MG PO TBEC: 81 MG | ORAL | Qty: 1

## 2023-05-08 MED FILL — ALLOPURINOL 100 MG PO TABS: 100 MG | ORAL | Qty: 1

## 2023-05-08 MED FILL — LOSARTAN POTASSIUM 50 MG PO TABS: 50 MG | ORAL | Qty: 2

## 2023-05-08 MED FILL — METFORMIN HCL ER 500 MG PO TB24: 500 MG | ORAL | Qty: 1

## 2023-05-08 NOTE — ED Notes (Signed)
Officers here for patient to be transported to [facility]. Sister called and notified with patient's permission.    Sister is highly upset and wants to know if anything can be done to change where the patient is going. Sister is upset that it is so far away and states that the reviews are horrible. Explained to patient that the ER does not place the patients and because he is currently involuntary, it is up to the city to find the facility. Sister does not understand why he he has to go so far away. Explained to sister that it is dependent on bed availability and other factors. Sister verbalizes understanding.

## 2023-05-08 NOTE — Other (Signed)
Crisis Note: Cherre Blanc, [phone], informed this Clinical research associate that patient has been accepted at [facility] for inpatient [facility] services. Dr. Sundra Aland, ED-RN, patient, and Gershon Crane, [phone], made aware of discharge disposition plans.     Accepting Physician: Dr. Rosemary Holms    Number to call report: [facility], [phone], ext: [phone]    [facility] will provide transportation. Patient is TDO'd.

## 2023-05-08 NOTE — ED Notes (Signed)
Sheriff here to initiate TDO

## 2023-05-15 ENCOUNTER — Ambulatory Visit (HOSPITAL_COMMUNITY): Payer: Self-pay | Admitting: Psychiatry

## 2024-04-30 ENCOUNTER — Encounter: Payer: BLUE CROSS/BLUE SHIELD | Attending: Internal Medicine | Primary: Internal Medicine
